# Patient Record
Sex: Male | Born: 1955 | Race: White | Hispanic: No | Marital: Married | Smoking: Former smoker
Health system: Southern US, Community
[De-identification: ages and names within clinical notes are randomized; demographics above are authoritative.]

## PROBLEM LIST (undated history)

## (undated) DIAGNOSIS — M545 Low back pain, unspecified: Secondary | ICD-10-CM

## (undated) DIAGNOSIS — Z9289 Personal history of other medical treatment: Secondary | ICD-10-CM

## (undated) DIAGNOSIS — K469 Unspecified abdominal hernia without obstruction or gangrene: Secondary | ICD-10-CM

## (undated) DIAGNOSIS — I2510 Atherosclerotic heart disease of native coronary artery without angina pectoris: Secondary | ICD-10-CM

## (undated) DIAGNOSIS — Z720 Tobacco use: Secondary | ICD-10-CM

## (undated) DIAGNOSIS — E785 Hyperlipidemia, unspecified: Secondary | ICD-10-CM

## (undated) DIAGNOSIS — L409 Psoriasis, unspecified: Secondary | ICD-10-CM

## (undated) HISTORY — PX: HERNIA REPAIR: SHX51

## (undated) HISTORY — PX: KNEE SURGERY: SHX244

## (undated) HISTORY — DX: Atherosclerotic heart disease of native coronary artery without angina pectoris: I25.10

## (undated) HISTORY — PX: CHOLECYSTECTOMY: SHX55

---

## 2011-09-28 ENCOUNTER — Other Ambulatory Visit: Payer: Self-pay

## 2011-09-28 DIAGNOSIS — R0602 Shortness of breath: Secondary | ICD-10-CM | POA: Insufficient documentation

## 2011-09-28 DIAGNOSIS — R079 Chest pain, unspecified: Secondary | ICD-10-CM | POA: Insufficient documentation

## 2011-09-28 NOTE — ED Notes (Signed)
Pt states that he went to collect some money from a guy in a hotel room and they had been smoking marijuana.  Pt states that the smell set of some chest pain and RUQ abdominal pain.

## 2011-09-29 ENCOUNTER — Emergency Department (HOSPITAL_BASED_OUTPATIENT_CLINIC_OR_DEPARTMENT_OTHER)
Admission: EM | Admit: 2011-09-29 | Discharge: 2011-09-29 | Discharge status: Against Medical Advice | Payer: 59 | Attending: Emergency Medicine | Admitting: Emergency Medicine

## 2011-10-01 ENCOUNTER — Ambulatory Visit
Admission: RE | Admit: 2011-10-01 | Discharge: 2011-10-01 | Disposition: A | Discharge status: Home / Self Care | Payer: 59 | Source: Ambulatory Visit | Attending: Emergency Medicine | Admitting: Emergency Medicine

## 2011-10-01 ENCOUNTER — Other Ambulatory Visit: Payer: Self-pay | Admitting: Emergency Medicine

## 2011-10-01 DIAGNOSIS — M25511 Pain in right shoulder: Secondary | ICD-10-CM

## 2011-10-01 DIAGNOSIS — M542 Cervicalgia: Secondary | ICD-10-CM

## 2016-03-31 DIAGNOSIS — I2510 Atherosclerotic heart disease of native coronary artery without angina pectoris: Secondary | ICD-10-CM

## 2016-03-31 HISTORY — DX: Atherosclerotic heart disease of native coronary artery without angina pectoris: I25.10

## 2016-04-01 ENCOUNTER — Inpatient Hospital Stay
Admission: EM | Admit: 2016-04-01 | Discharge: 2016-04-04 | DRG: 247 | Disposition: A | Discharge status: Home / Self Care | Payer: Managed Care, Other (non HMO) | Attending: Internal Medicine | Admitting: Internal Medicine

## 2016-04-01 ENCOUNTER — Emergency Department: Payer: Managed Care, Other (non HMO)

## 2016-04-01 DIAGNOSIS — K219 Gastro-esophageal reflux disease without esophagitis: Secondary | ICD-10-CM | POA: Diagnosis present

## 2016-04-01 DIAGNOSIS — I2582 Chronic total occlusion of coronary artery: Secondary | ICD-10-CM | POA: Diagnosis present

## 2016-04-01 DIAGNOSIS — I25110 Atherosclerotic heart disease of native coronary artery with unstable angina pectoris: Secondary | ICD-10-CM | POA: Diagnosis present

## 2016-04-01 DIAGNOSIS — I214 Non-ST elevation (NSTEMI) myocardial infarction: Principal | ICD-10-CM | POA: Diagnosis present

## 2016-04-01 DIAGNOSIS — R079 Chest pain, unspecified: Secondary | ICD-10-CM

## 2016-04-01 DIAGNOSIS — E785 Hyperlipidemia, unspecified: Secondary | ICD-10-CM | POA: Diagnosis not present

## 2016-04-01 DIAGNOSIS — I10 Essential (primary) hypertension: Secondary | ICD-10-CM

## 2016-04-01 DIAGNOSIS — E876 Hypokalemia: Secondary | ICD-10-CM | POA: Diagnosis present

## 2016-04-01 DIAGNOSIS — Z8249 Family history of ischemic heart disease and other diseases of the circulatory system: Secondary | ICD-10-CM

## 2016-04-01 DIAGNOSIS — D72829 Elevated white blood cell count, unspecified: Secondary | ICD-10-CM | POA: Diagnosis present

## 2016-04-01 DIAGNOSIS — R739 Hyperglycemia, unspecified: Secondary | ICD-10-CM | POA: Diagnosis present

## 2016-04-01 DIAGNOSIS — F17210 Nicotine dependence, cigarettes, uncomplicated: Secondary | ICD-10-CM | POA: Diagnosis present

## 2016-04-01 DIAGNOSIS — I209 Angina pectoris, unspecified: Secondary | ICD-10-CM | POA: Diagnosis not present

## 2016-04-01 DIAGNOSIS — L409 Psoriasis, unspecified: Secondary | ICD-10-CM | POA: Diagnosis present

## 2016-04-01 DIAGNOSIS — I2510 Atherosclerotic heart disease of native coronary artery without angina pectoris: Secondary | ICD-10-CM | POA: Diagnosis not present

## 2016-04-01 HISTORY — DX: Psoriasis, unspecified: L40.9

## 2016-04-01 HISTORY — DX: Unspecified abdominal hernia without obstruction or gangrene: K46.9

## 2016-04-01 HISTORY — DX: Low back pain: M54.5

## 2016-04-01 HISTORY — DX: Tobacco use: Z72.0

## 2016-04-01 HISTORY — DX: Low back pain, unspecified: M54.50

## 2016-04-01 HISTORY — DX: Hyperlipidemia, unspecified: E78.5

## 2016-04-01 LAB — URINE DRUG SCREEN, QUALITATIVE (ARMC ONLY)
AMPHETAMINES, UR SCREEN: NOT DETECTED
Barbiturates, Ur Screen: NOT DETECTED
Benzodiazepine, Ur Scrn: NOT DETECTED
Cannabinoid 50 Ng, Ur ~~LOC~~: NOT DETECTED
Cocaine Metabolite,Ur ~~LOC~~: NOT DETECTED
MDMA (ECSTASY) UR SCREEN: NOT DETECTED
Methadone Scn, Ur: NOT DETECTED
OPIATE, UR SCREEN: NOT DETECTED
PHENCYCLIDINE (PCP) UR S: NOT DETECTED
Tricyclic, Ur Screen: NOT DETECTED

## 2016-04-01 LAB — LIPASE, BLOOD: Lipase: 22 U/L (ref 11–51)

## 2016-04-01 LAB — CBC WITH DIFFERENTIAL/PLATELET
BASOS ABS: 0.1 10*3/uL (ref 0–0.1)
BASOS PCT: 1 %
Eosinophils Absolute: 0.2 10*3/uL (ref 0–0.7)
Eosinophils Relative: 2 %
HEMATOCRIT: 52 % (ref 40.0–52.0)
HEMOGLOBIN: 17.8 g/dL (ref 13.0–18.0)
Lymphocytes Relative: 21 %
Lymphs Abs: 2.2 10*3/uL (ref 1.0–3.6)
MCH: 30.1 pg (ref 26.0–34.0)
MCHC: 34.3 g/dL (ref 32.0–36.0)
MCV: 87.9 fL (ref 80.0–100.0)
MONOS PCT: 8 %
Monocytes Absolute: 0.8 10*3/uL (ref 0.2–1.0)
NEUTROS ABS: 7.4 10*3/uL — AB (ref 1.4–6.5)
NEUTROS PCT: 68 %
Platelets: 240 10*3/uL (ref 150–440)
RBC: 5.92 MIL/uL — ABNORMAL HIGH (ref 4.40–5.90)
RDW: 13.6 % (ref 11.5–14.5)
WBC: 10.8 10*3/uL — AB (ref 3.8–10.6)

## 2016-04-01 LAB — COMPREHENSIVE METABOLIC PANEL
ALBUMIN: 4.8 g/dL (ref 3.5–5.0)
ALK PHOS: 82 U/L (ref 38–126)
ALT: 14 U/L — ABNORMAL LOW (ref 17–63)
AST: 28 U/L (ref 15–41)
Anion gap: 9 (ref 5–15)
BILIRUBIN TOTAL: 0.7 mg/dL (ref 0.3–1.2)
BUN: 13 mg/dL (ref 6–20)
CALCIUM: 9.4 mg/dL (ref 8.9–10.3)
CO2: 23 mmol/L (ref 22–32)
Chloride: 104 mmol/L (ref 101–111)
Creatinine, Ser: 1.12 mg/dL (ref 0.61–1.24)
GFR calc Af Amer: >60 mL/min (ref >60)
GFR calc non Af Amer: >60 mL/min (ref >60)
GLUCOSE: 127 mg/dL — AB (ref 65–99)
Potassium: 3.1 mmol/L — ABNORMAL LOW (ref 3.5–5.1)
Sodium: 136 mmol/L (ref 135–145)
TOTAL PROTEIN: 8.5 g/dL — AB (ref 6.5–8.1)

## 2016-04-01 LAB — LIPID PANEL
CHOLESTEROL: 199 mg/dL (ref 0–200)
HDL: 31 mg/dL — AB (ref >40)
LDL CALC: 153 mg/dL — AB (ref 0–99)
TRIGLYCERIDES: 76 mg/dL (ref <150)
Total CHOL/HDL Ratio: 6.4 RATIO
VLDL: 15 mg/dL (ref 0–40)

## 2016-04-01 LAB — TROPONIN I
Troponin I: 0.04 ng/mL — ABNORMAL HIGH (ref <0.031)
Troponin I: 1.15 ng/mL — ABNORMAL HIGH (ref <0.031)

## 2016-04-01 LAB — FIBRIN DERIVATIVES D-DIMER (ARMC ONLY): Fibrin derivatives D-dimer (ARMC): 916 — ABNORMAL HIGH (ref 0–499)

## 2016-04-01 LAB — APTT: aPTT: 27 seconds (ref 24–36)

## 2016-04-01 LAB — PROTIME-INR
INR: 0.9
Prothrombin Time: 12.4 seconds (ref 11.4–15.0)

## 2016-04-01 LAB — MAGNESIUM: Magnesium: 2 mg/dL (ref 1.7–2.4)

## 2016-04-01 MED ORDER — IOPAMIDOL (ISOVUE-370) INJECTION 76%
75.0000 mL | Freq: Once | INTRAVENOUS | Status: AC | PRN
  Administered 2016-04-01: 75 mL via INTRAVENOUS

## 2016-04-01 MED ORDER — PANTOPRAZOLE SODIUM 40 MG PO TBEC
40.0000 mg | DELAYED_RELEASE_TABLET | Freq: Every day | ORAL | Status: DC
  Administered 2016-04-01 – 2016-04-04 (×3): 40 mg via ORAL
  Filled 2016-04-01 (×3): qty 1

## 2016-04-01 MED ORDER — NITROGLYCERIN 2 % TD OINT
1.0000 [in_us] | TOPICAL_OINTMENT | Freq: Four times a day (QID) | TRANSDERMAL | Status: DC
  Administered 2016-04-01 – 2016-04-03 (×5): 1 [in_us] via TOPICAL
  Filled 2016-04-01 (×5): qty 1

## 2016-04-01 MED ORDER — METOPROLOL TARTRATE 25 MG PO TABS: 25.0000 mg | ORAL_TABLET | Freq: Four times a day (QID) | ORAL | Status: DC

## 2016-04-01 MED ORDER — NICOTINE POLACRILEX 2 MG MT GUM
2.0000 mg | CHEWING_GUM | OROMUCOSAL | Status: DC | PRN
  Filled 2016-04-01: qty 1

## 2016-04-01 MED ORDER — POTASSIUM CHLORIDE CRYS ER 20 MEQ PO TBCR
40.0000 meq | EXTENDED_RELEASE_TABLET | Freq: Two times a day (BID) | ORAL | Status: AC
  Administered 2016-04-01: 40 meq via ORAL
  Filled 2016-04-01: qty 2

## 2016-04-01 MED ORDER — ASPIRIN 81 MG PO CHEW
324.0000 mg | CHEWABLE_TABLET | Freq: Once | ORAL | Status: AC
  Administered 2016-04-01: 324 mg via ORAL
  Filled 2016-04-01: qty 4

## 2016-04-01 MED ORDER — GI COCKTAIL ~~LOC~~
30.0000 mL | Freq: Once | ORAL | Status: AC
  Administered 2016-04-01: 30 mL via ORAL
  Filled 2016-04-01: qty 30

## 2016-04-01 MED ORDER — HEPARIN BOLUS VIA INFUSION
4000.0000 [IU] | Freq: Once | INTRAVENOUS | Status: AC
  Administered 2016-04-01: 4000 [IU] via INTRAVENOUS
  Filled 2016-04-01: qty 4000

## 2016-04-01 MED ORDER — HEPARIN (PORCINE) IN NACL 100-0.45 UNIT/ML-% IJ SOLN
1100.0000 [IU]/h | INTRAMUSCULAR | Status: DC
  Administered 2016-04-01: 1100 [IU]/h via INTRAVENOUS
  Filled 2016-04-01 (×2): qty 250

## 2016-04-01 MED ORDER — NITROGLYCERIN 2 % TD OINT
1.0000 [in_us] | TOPICAL_OINTMENT | Freq: Once | TRANSDERMAL | Status: AC
  Administered 2016-04-01: 1 [in_us] via TOPICAL
  Filled 2016-04-01: qty 1

## 2016-04-01 MED ORDER — ONDANSETRON HCL 4 MG/2ML IJ SOLN
4.0000 mg | Freq: Once | INTRAMUSCULAR | Status: AC
  Administered 2016-04-01: 4 mg via INTRAVENOUS
  Filled 2016-04-01: qty 2

## 2016-04-01 MED ORDER — NITROGLYCERIN 0.4 MG SL SUBL
0.4000 mg | SUBLINGUAL_TABLET | SUBLINGUAL | Status: DC | PRN
  Administered 2016-04-01 (×2): 0.4 mg via SUBLINGUAL

## 2016-04-01 MED ORDER — HYDROMORPHONE HCL 1 MG/ML IJ SOLN
1.0000 mg | Freq: Once | INTRAMUSCULAR | Status: AC
  Administered 2016-04-01: 1 mg via INTRAVENOUS
  Filled 2016-04-01: qty 1

## 2016-04-01 NOTE — ED Notes (Signed)
Pt satting 89% r/a. Placed on 2 L [place].  

## 2016-04-01 NOTE — Progress Notes (Signed)
ANTICOAGULATION CONSULT NOTE - Initial Consult  Pharmacy Consult for Heparin drip Indication: chest pain/ACS  Allergies  Allergen Reactions  . Fluticasone Swelling    Patient Measurements: Height: 5\' 9"  (175.3 cm) Weight: 200 lb (90.719 kg) IBW/kg (Calculated) : 70.7 Heparin Dosing Weight: na  Vital Signs: Temp: 97.4 F (36.3 C) (04/02 1647) Temp Source: Oral (04/02 1647) BP: 115/68 mmHg (04/02 2121) Pulse Rate: 66 (04/02 2121)  Labs:  Recent Labs  04/01/16 1648 04/01/16 1649 04/01/16 1951  HGB  --  17.8  --   HCT  --  52.0  --   PLT  --  240  --   APTT 27  --   --   LABPROT 12.4  --   --   INR 0.90  --   --   CREATININE  --  1.12  --   TROPONINI  --  0.04* 1.15*    Estimated Creatinine Clearance: 79.1 mL/min (by C-G formula based on Cr of 1.12).   Medical History: History reviewed. No pertinent past medical history.  Medications:  Infusions:  . heparin      Assessment: Patient is a 61yo male admitted for AMI. Pharmacy consulted for heparin dosing.  Goal of Therapy:  Heparin level 0.3-0.7 units/ml Monitor platelets by anticoagulation protocol: Yes   Plan:  Give 4000 units bolus x 1 Start heparin infusion at 1100 units/hr Check anti-Xa level in 6 hours and daily while on heparin Continue to monitor H&H and platelets  Clovia CuffLisa Nicolis Boody, PharmD, BCPS 04/01/2016 9:27 PM

## 2016-04-01 NOTE — ED Notes (Signed)
Troponin 0.04. MD notified.  

## 2016-04-01 NOTE — ED Notes (Signed)
Pt came to ED c/o central chest pain. Pt reports it feels "like the worse acid reflex i've ever had." Pt reports it started about an hour ago when he was doing yard work. Pt denies nausea, vomiting.

## 2016-04-01 NOTE — ED Notes (Signed)
Pt transported to CT ?

## 2016-04-01 NOTE — ED Notes (Addendum)
Charge nurse Sue Lushndrea called telemetry to ask about bed ready status. Secretary informed her that there is no ETA for time that it will take to make the room ready.

## 2016-04-01 NOTE — H&P (Addendum)
[facility] [place] [facility]   PATIENT NAME: Jesse Soto    MR#:  [id number]  DATE OF BIRTH:  Jun 12, 1955  DATE OF ADMISSION:  04/01/2016  PRIMARY CARE PHYSICIAN: No primary care provider on file.   REQUESTING/REFERRING PHYSICIAN:   CHIEF COMPLAINT:   Chief Complaint  Patient presents with  . Chest Pain    HISTORY OF PRESENT ILLNESS: Jesse Soto  is a 61 y.o. male with a known history of Ongoing tobacco abuse, chronic abdominal pain which she attributes to hiatal hernia who comes to the hospital with complaints of right-sided lower chest area pain, which started at around 1:02 PM earlier today after some exertion. Pain was described as 10 out of 10 by intensity, achy pain lasting for approximately 2 hours, radiating to right jaw. Patient felt weak, short of breath, lightheaded, as well as dizzy and presyncopal. On arrival to the emergency room he was noted to have elevated troponin, which got worse on repeat his lab in 4 hours. EKG revealed ST elevation in V2, V3. Hospitalist services were contacted for admission  PAST MEDICAL HISTORY:  History reviewed. No pertinent past medical history.  PAST SURGICAL HISTORY: Past Surgical History  Procedure Laterality Date  . Hernia repair    . Cholecystectomy    . Knee surgery      SOCIAL HISTORY:  Social History  Substance Use Topics  . Smoking status: Current Every Day Smoker -- 1.00 packs/day for 30 years    Types: Cigarettes  . Smokeless tobacco: Never Used  . Alcohol Use: Yes     Comment: occasional use  Admits of using marijuana  FAMILY HISTORY: No early coronary artery disease  DRUG ALLERGIES:  Allergies  Allergen Reactions  . Fluticasone Swelling    Review of Systems  Constitutional: Positive for malaise/fatigue. Negative for fever, chills and weight loss.  HENT: Negative for congestion.   Eyes: Positive for blurred vision. Negative for double vision.  Respiratory: Positive for cough,  sputum production, shortness of breath and wheezing.   Cardiovascular: Positive for chest pain. Negative for palpitations, orthopnea, leg swelling and PND.  Gastrointestinal: Positive for heartburn, nausea, abdominal pain and constipation. Negative for vomiting, diarrhea, blood in stool and melena.  Genitourinary: Positive for dysuria. Negative for urgency, frequency and hematuria.  Musculoskeletal: Negative for falls.  Skin: Negative for rash.  Neurological: Positive for weakness and headaches. Negative for dizziness.  Psychiatric/Behavioral: Negative for depression and memory loss. The patient is not nervous/anxious.     MEDICATIONS AT HOME:  Prior to Admission medications   Medication Sig Start Date End Date Taking? Authorizing Provider  cyclobenzaprine (FLEXERIL) 5 MG tablet Take 5 mg by mouth daily as needed for muscle spasms.    Yes Historical Provider, MD  traMADol (ULTRAM) 50 MG tablet Take 50 mg by mouth daily.    Yes Historical Provider, MD      PHYSICAL EXAMINATION:   VITAL SIGNS: Blood pressure 112/61, pulse 59, temperature 97.4 F (36.3 C), temperature source Oral, resp. rate 15, SpO2 93 %.  GENERAL:  61 y.o.-year-old patient lying in the bed with no acute distress.  EYES: Pupils equal, round, reactive to light and accommodation. No scleral icterus. Extraocular muscles intact.  HEENT: Head atraumatic, normocephalic. Oropharynx and nasopharynx clear.  NECK:  Supple, no jugular venous distention. No thyroid enlargement, no tenderness.  LUNGS: Some diminished breath sounds bilaterally, no wheezing, few scattered rales,rhonchi , but no crepitations on auscultation. No use of accessory muscles of  respiration. Some discomfort on palpation of the chest wall CARDIOVASCULAR: S1, S2 normal. No murmurs, rubs, or gallops.  ABDOMEN: Soft, mild epigastric tenderness, no rebound or guarding, nondistended. Bowel sounds present. No organomegaly or mass.  EXTREMITIES: No pedal edema,  cyanosis, or clubbing.  NEUROLOGIC: Cranial nerves II through XII are intact. Muscle strength 5/5 in all extremities. Sensation intact. Gait not checked.  PSYCHIATRIC: The patient is alert and oriented x 3.  SKIN: No obvious rash, lesion, or ulcer.   LABORATORY PANEL:   CBC  Recent Labs Lab 04/01/16 1649  WBC 10.8*  HGB 17.8  HCT 52.0  PLT 240  MCV 87.9  MCH 30.1  MCHC 34.3  RDW 13.6  LYMPHSABS 2.2  MONOABS 0.8  EOSABS 0.2  BASOSABS 0.1   ------------------------------------------------------------------------------------------------------------------  Chemistries   Recent Labs Lab 04/01/16 1649  NA 136  K 3.1*  CL 104  CO2 23  GLUCOSE 127*  BUN 13  CREATININE 1.12  CALCIUM 9.4  AST 28  ALT 14*  ALKPHOS 82  BILITOT 0.7   ------------------------------------------------------------------------------------------------------------------  Cardiac Enzymes  Recent Labs Lab 04/01/16 1649 04/01/16 1951  TROPONINI 0.04* 1.15*   ------------------------------------------------------------------------------------------------------------------  RADIOLOGY: Ct Angio Chest Pe W/cm &/or Wo Cm  04/01/2016  CLINICAL DATA:  Acute onset of central chest pain. EXAM: CT ANGIOGRAPHY CHEST WITH CONTRAST TECHNIQUE: Multidetector CT imaging of the chest was performed using the standard protocol during bolus administration of intravenous contrast. Multiplanar CT image reconstructions and MIPs were obtained to evaluate the vascular anatomy. CONTRAST:  75 cc Isovue 370 COMPARISON:  None. FINDINGS: Mediastinum/Nodes: No chest wall mass, supraclavicular or axillary lymphadenopathy. The heart is normal in size. No pericardial effusion. The aorta is normal in caliber. No dissection. Scattered atherosclerotic calcifications at the aortic arch. The branch vessels are patent. The pulmonary arterial tree is well opacified. No filling defects to suggest pulmonary embolism. No mediastinal or hilar  mass or adenopathy. Small scattered lymph nodes are noted. Lungs/Pleura: Emphysematous changes and pulmonary scarring. There is dependent atelectasis/edema but no infiltrates, effusions or worrisome pulmonary lesions. Upper abdomen: No significant findings. Musculoskeletal: No significant findings. Moderate degenerative changes in the lower thoracic spine and scoliosis. Review of the MIP images confirms the above findings. IMPRESSION: 1. No CT findings for pulmonary embolism. 2. Mild atherosclerotic calcifications involving the aorta but no aneurysm or dissection. 3. Emphysematous changes and pulmonary scarring. Dependent atelectasis but no infiltrates or effusions. Electronically Signed   By: Rudie Meyer M.D.   On: 04/01/2016 19:14   Dg Chest Port 1 View  04/01/2016  CLINICAL DATA:  Central chest pain beginning this afternoon while doing yard work. EXAM: PORTABLE CHEST 1 VIEW COMPARISON:  None. FINDINGS: Lungs are adequately inflated without focal consolidation or effusion. Cardiomediastinal silhouette is within normal. Minimal degenerative change of the spine. IMPRESSION: No acute cardiopulmonary disease. Electronically Signed   By: Elberta Fortis M.D.   On: 04/01/2016 17:23    EKG: Orders placed or performed during the hospital encounter of 04/01/16  . EKG 12-Lead  . EKG 12-Lead  . EKG 12-Lead  . EKG 12-Lead  . EKG 12-Lead  . EKG 12-Lead  . EKG 12-Lead  . EKG 12-Lead   Initial EKG was reported as atrial fibrillation. However, repeated EKG in 2 minutes revealed normal sinus rhythm with abnormal R-wave progression, early transition, elevated ST in V2, V3, concerning for anterior injury, nonspecific ST-T changes in all other leads, one more EKG done at around 8 PM revealed sinus rhythm at  70 bpm, normal axis, ventricular premature complex, pursue infarcts, old, borderline T abnormalities in inferior leads IMPRESSION AND PLAN:  Active Problems:   Acute MI, subendocardial, initial episode of care  (HCC)   HTN (hypertension)   Subendocardial MI first episode care [facility](HCC) #1 Acute MI, subendocardial, initial episode of care, and the patient, medical follow initiate him on metoprolol, aspirin, nitroglycerin and heparin intravenously, following cardiac enzymes, sensory, getting cardiologist involved for further recommendations, possibly cardiac catheterization tomorrow morning . Get urine drug screen and lipid panel #2. Elevated blood pressure without known history of Hypertension,  continue patient on metoprolol, nitroglycerin #3. Leukocytosis, unclear etiology #4. Hypokalemia, supplement orally, check magnesium level and supplemented as needed. #5. Hyperglycemia, get hemoglobin A1c #6. Epigastric abdominal pain, questionable gastritis, initiate patient on Protonix #7 tobacco abuse counseling. Discussed this patient for approximately 5 minutes, nicotine replacement therapy will be initiated, patient is agreeable  All the records are reviewed and case discussed with ED provider. Management plans discussed with the patient, family and they are in agreement.  CODE STATUS: Code Status History    This patient does not have a recorded code status. Please follow your organizational policy for patients in this situation.       TOTAL TIME TAKING CARE OF THIS PATIENT: 55 minutes.    Katharina CaperVAICKUTE,Dream Nodal M.D on 04/01/2016 at 9:15 PM  Between 7am to 6pm - Pager - [phone] After 6pm go to [email or web address] - password [email or web address] [facility]  [facility]  Office  [phone]  CC: Primary care physician; No primary care provider on file.

## 2016-04-01 NOTE — ED Notes (Signed)
Called to 2A and spoke with charge nurse Lexi. Per CN they are going to have to change the room number and are waiting for it to be cleaned. This RN will wait for the new room to be ready.

## 2016-04-01 NOTE — ED Notes (Signed)
AC aware of time to bed ready. Will continue to wait.

## 2016-04-01 NOTE — ED Provider Notes (Signed)
Time Seen: Approximately 1650 I have reviewed the triage notes  Chief Complaint: Chest Pain   History of Present Illness: Jesse Soto is a 61 y.o. male *who presents with a relatively acute onset of some midline chest discomfort. Patient states he has a history of a hiatal hernia and has had previous surgery. He states he was seen recently had an outside hospital for the discomfort from the epigastric area and was referred back to his primary physician. Since holding the right lower part of his chest but again states the pain is epigastric and midline. He denies any radiation to the arm, jaw, or back region. He has had some nausea but no vomiting. Patient denies any true shortness of breath or pleuritic or positional component to his pain. He is not aware of any fever, chills or productive cough. He describes this as being similar to his reflux pain but states is the "" worst reflux pain he's ever had "". He states he has chronic chest discomfort higher up and points mainly to the sternal area and states he's been doing a lot of hard work and lifting outside and is not unusual for him to have pain in this area.   History reviewed. No pertinent past medical history.  Patient Active Problem List   Diagnosis Date Noted  . Acute MI, subendocardial, initial episode of care (HCC) 04/01/2016  . HTN (hypertension) 04/01/2016  . Subendocardial MI first episode care [facility]) 04/01/2016    Past Surgical History  Procedure Laterality Date  . Hernia repair    . Cholecystectomy    . Knee surgery      Past Surgical History  Procedure Laterality Date  . Hernia repair    . Cholecystectomy    . Knee surgery      Current Outpatient Rx  Name  Route  Sig  Dispense  Refill  . cyclobenzaprine (FLEXERIL) 5 MG tablet   Oral   Take 5 mg by mouth daily as needed for muscle spasms.          . traMADol (ULTRAM) 50 MG tablet   Oral   Take 50 mg by mouth daily.            Allergies:   Fluticasone  Family History: No family history on file.  Social History: Social History  Substance Use Topics  . Smoking status: Current Every Day Smoker -- 1.00 packs/day for 30 years    Types: Cigarettes  . Smokeless tobacco: Never Used  . Alcohol Use: Yes     Comment: occasional use     Review of Systems:   10 point review of systems was performed and was otherwise negative:  Constitutional: No fever Eyes: No visual disturbances ENT: No sore throat, ear pain Cardiac: Chest and epigastric pain described above Respiratory: No shortness of breath, wheezing, or stridor Abdomen: No abdominal pain, no vomiting, No diarrhea Endocrine: No weight loss, No night sweats Extremities: No peripheral edema, cyanosis Skin: No rashes, easy bruising Neurologic: No focal weakness, trouble with speech or swollowing Urologic: No dysuria, Hematuria, or urinary frequency   Physical Exam:  ED Triage Vitals  Enc Vitals Group     BP 04/01/16 1647 150/92 mmHg     Pulse Rate 04/01/16 1647 74     Resp 04/01/16 1647 14     Temp 04/01/16 1647 97.4 F (36.3 C)     Temp Source 04/01/16 1647 Oral     SpO2 04/01/16 1647 95 %  Weight 04/01/16 2124 200 lb (90.719 kg)     Height 04/01/16 2124 5\' 9"  (1.753 m)     Head Cir --      Peak Flow --      Pain Score 04/01/16 1923 1     Pain Loc --      Pain Edu? --      Excl. in GC? --     General: Awake , Alert , and Oriented times 3; GCS 15. Anxious and appears uncomfortable with no diaphoresis and no active vomiting Head: Normal cephalic , atraumatic Eyes: Pupils equal , round, reactive to light Nose/Throat: No nasal drainage, patent upper airway without erythema or exudate.  Neck: Supple, Full range of motion, No anterior adenopathy or palpable thyroid masses Lungs: Clear to ascultation without wheezes , rhonchi, or rales Heart: Regular rate, regular rhythm without murmurs , gallops , or rubs Abdomen: Soft, non tender without rebound,  guarding , or rigidity; bowel sounds positive and symmetric in all 4 quadrants. No organomegaly .        Extremities: 2 plus symmetric pulses. No edema, clubbing or cyanosis Neurologic: normal ambulation, Motor symmetric without deficits, sensory intact Skin: warm, dry, no rashes Chest wall shows reproducible pain mid sternal region without any crepitus or step-off noted no surrounding erythema.  Labs:   All laboratory work was reviewed including any pertinent negatives or positives listed below:  Labs Reviewed  CBC WITH DIFFERENTIAL/PLATELET - Abnormal; Notable for the following:    WBC 10.8 (*)    RBC 5.92 (*)    Neutro Abs 7.4 (*)    All other components within normal limits  COMPREHENSIVE METABOLIC PANEL - Abnormal; Notable for the following:    Potassium 3.1 (*)    Glucose, Bld 127 (*)    Total Protein 8.5 (*)    ALT 14 (*)    All other components within normal limits  TROPONIN I - Abnormal; Notable for the following:    Troponin I 0.04 (*)    All other components within normal limits  FIBRIN DERIVATIVES D-DIMER ([facility] ONLY) - Abnormal; Notable for the following:    Fibrin derivatives D-dimer ([facility]) 916 (*)    All other components within normal limits  TROPONIN I - Abnormal; Notable for the following:    Troponin I 1.15 (*)    All other components within normal limits  LIPASE, BLOOD  URINE DRUG SCREEN, QUALITATIVE ([facility] ONLY)  PROTIME-INR  APTT  HEPARIN LEVEL (UNFRACTIONATED)  CBC  LIPID PANEL  MAGNESIUM  HEMOGLOBIN A1C   The patient's laboratory work shows an increasing troponin. His D-dimer test was elevated  EKG: EKG #1 1644 Rate? Insert normal EKG Poor quality with some nonspecific ST wave elevation seen in leads V1 and V2, single PVC No obvious STEMI criteria and EKG was repeated  EKG #2 ED ECG REPORT I, Jennye MoccasinBrian S Shynice Sigel, the attending physician, personally viewed and interpreted this ECG.  Date: 04/01/2016 EKG Time: 1646 Rate: *81 Rhythm: normal sinus  rhythm QRS Axis: normal Intervals: normal ST/T Wave abnormalities: Nonspecific ST wave elevation lead V1 and V2 Mild ST depressions seen in lead V5 V6 Conduction Disturbances: none Narrative Interpretation: Questionable ischemic changes without any STEMI criteria  EKG #3 ED ECG REPORT I, Jennye MoccasinBrian S Amiria Orrison, the attending physician, personally viewed and interpreted this ECG.  Date: 04/01/2016 EKG Time: 2036 Rate: 70 Rhythm: normal sinus rhythm with occasional PVC QRS Axis: normal Intervals: normal ST/T Wave abnormalities: Continue nonspecific ST wave elevation lead V2,  resolution of ST depression seen in the lateral leads Nonspecific T wave abnormality seen in leads 3 Conduction Disturbances: none Narrative Interpretation: Concerning for ischemic changes but again no STEMI criteria and minimal change from previous EKG   Radiology: *  CLINICAL DATA: Acute onset of central chest pain.  EXAM: CT ANGIOGRAPHY CHEST WITH CONTRAST  TECHNIQUE: Multidetector CT imaging of the chest was performed using the standard protocol during bolus administration of intravenous contrast. Multiplanar CT image reconstructions and MIPs were obtained to evaluate the vascular anatomy.  CONTRAST: 75 cc Isovue 370  COMPARISON: None.  FINDINGS: Mediastinum/Nodes: No chest wall mass, supraclavicular or axillary lymphadenopathy.  The heart is normal in size. No pericardial effusion. The aorta is normal in caliber. No dissection. Scattered atherosclerotic calcifications at the aortic arch. The branch vessels are patent.  The pulmonary arterial tree is well opacified. No filling defects to suggest pulmonary embolism.  No mediastinal or hilar mass or adenopathy. Small scattered lymph nodes are noted.  Lungs/Pleura: Emphysematous changes and pulmonary scarring. There is dependent atelectasis/edema but no infiltrates, effusions or worrisome pulmonary lesions.  Upper abdomen: No significant  findings.  Musculoskeletal: No significant findings. Moderate degenerative changes in the lower thoracic spine and scoliosis.  Review of the MIP images confirms the above findings.  IMPRESSION: 1. No CT findings for pulmonary embolism. 2. Mild atherosclerotic calcifications involving the aorta but no aneurysm or dissection. 3. Emphysematous changes and pulmonary scarring. Dependent atelectasis but no infiltrates or effusions.    I personally reviewed the radiologic studies    Critical Care:  CRITICAL CARE Performed by: Jennye Moccasin   Total critical care time: 35 minutes  Critical care time was exclusive of separately billable procedures and treating other patients.  Critical care was necessary to treat or prevent imminent or life-threatening deterioration.  Critical care was time spent personally by me on the following activities: development of treatment plan with patient and/or surrogate as well as nursing, discussions with consultants, evaluation of patient's response to treatment, examination of patient, obtaining history from patient or surrogate, ordering and performing treatments and interventions, ordering and review of laboratory studies, ordering and review of radiographic studies, pulse oximetry and re-evaluation of patient's condition. Initial evaluation for unstable angina versus acute myocardial infarction    ED Course:  Differential includes all life-threatening causes for chest pain. This includes but is not exclusive to acute coronary syndrome, aortic dissection, pulmonary embolism, cardiac tamponade, community-acquired pneumonia, pericarditis, musculoskeletal chest wall pain, etc. The patient was placed on a continuous cardiac monitor had some occasional PVCs but remained in a normal sinus rhythm. He was given aspirin and nitroglycerin therapy with resolution of the majority of his pain with only a dull ache remaining. His presentation was very nonspecific  and was initially advertises being gastrointestinal above concern is with the repeat troponin and a significant bump over the last troponin patient most likely has at the very least unstable angina and will require further inpatient observation. He is otherwise remained hemodynamically stable.    Assessment:  Acute coronary syndrome   Final Clinical Impression:   Final diagnoses:  Chest pain     Plan:  Inpatient management            Jennye Moccasin, MD 04/01/16 2205

## 2016-04-02 ENCOUNTER — Encounter
Admission: EM | Disposition: A | Discharge status: Home / Self Care | Payer: Self-pay | Source: Home / Self Care | Attending: Internal Medicine

## 2016-04-02 DIAGNOSIS — I214 Non-ST elevation (NSTEMI) myocardial infarction: Principal | ICD-10-CM

## 2016-04-02 DIAGNOSIS — I2510 Atherosclerotic heart disease of native coronary artery without angina pectoris: Secondary | ICD-10-CM

## 2016-04-02 HISTORY — PX: PERIPHERAL VASCULAR CATHETERIZATION: SHX172C

## 2016-04-02 HISTORY — PX: CARDIAC CATHETERIZATION: SHX172

## 2016-04-02 LAB — COMPREHENSIVE METABOLIC PANEL
ALT: 17 U/L (ref 17–63)
AST: 93 U/L — AB (ref 15–41)
Albumin: 3.7 g/dL (ref 3.5–5.0)
Alkaline Phosphatase: 68 U/L (ref 38–126)
Anion gap: 2 — ABNORMAL LOW (ref 5–15)
BUN: 15 mg/dL (ref 6–20)
CHLORIDE: 108 mmol/L (ref 101–111)
CO2: 26 mmol/L (ref 22–32)
CREATININE: 0.95 mg/dL (ref 0.61–1.24)
Calcium: 8.3 mg/dL — ABNORMAL LOW (ref 8.9–10.3)
GFR calc Af Amer: >60 mL/min (ref >60)
Glucose, Bld: 111 mg/dL — ABNORMAL HIGH (ref 65–99)
Potassium: 3.6 mmol/L (ref 3.5–5.1)
SODIUM: 136 mmol/L (ref 135–145)
Total Bilirubin: 0.6 mg/dL (ref 0.3–1.2)
Total Protein: 6.6 g/dL (ref 6.5–8.1)

## 2016-04-02 LAB — CBC
HCT: 45.2 % (ref 40.0–52.0)
HEMOGLOBIN: 15.6 g/dL (ref 13.0–18.0)
MCH: 30.1 pg (ref 26.0–34.0)
MCHC: 34.4 g/dL (ref 32.0–36.0)
MCV: 87.4 fL (ref 80.0–100.0)
Platelets: 204 10*3/uL (ref 150–440)
RBC: 5.17 MIL/uL (ref 4.40–5.90)
RDW: 13.3 % (ref 11.5–14.5)
WBC: 10.6 10*3/uL (ref 3.8–10.6)

## 2016-04-02 LAB — HEMOGLOBIN A1C: Hgb A1c MFr Bld: 5.7 % (ref 4.0–6.0)

## 2016-04-02 LAB — HEPARIN LEVEL (UNFRACTIONATED): Heparin Unfractionated: 0.68 IU/mL (ref 0.30–0.70)

## 2016-04-02 LAB — MRSA PCR SCREENING: MRSA by PCR: NEGATIVE

## 2016-04-02 SURGERY — LEFT HEART CATH AND CORONARY ANGIOGRAPHY
Anesthesia: Moderate Sedation

## 2016-04-02 MED ORDER — TIROFIBAN HCL IN NACL 5-0.9 MG/100ML-% IV SOLN: 0.1500 ug/kg/min | INTRAVENOUS | Status: AC

## 2016-04-02 MED ORDER — HEPARIN SODIUM (PORCINE) 1000 UNIT/ML IJ SOLN
INTRAMUSCULAR | Status: AC
  Filled 2016-04-02: qty 1

## 2016-04-02 MED ORDER — VERAPAMIL HCL 2.5 MG/ML IV SOLN
INTRAVENOUS | Status: DC | PRN
  Administered 2016-04-02: 2.5 mg via INTRA_ARTERIAL

## 2016-04-02 MED ORDER — FENTANYL CITRATE (PF) 100 MCG/2ML IJ SOLN
INTRAMUSCULAR | Status: DC | PRN
  Administered 2016-04-02: 50 ug via INTRAVENOUS
  Administered 2016-04-02: 25 ug via INTRAVENOUS

## 2016-04-02 MED ORDER — SODIUM CHLORIDE 0.9 % WEIGHT BASED INFUSION: 1.0000 mL/kg/h | INTRAVENOUS | Status: DC

## 2016-04-02 MED ORDER — ASPIRIN 81 MG PO CHEW
CHEWABLE_TABLET | ORAL | Status: AC
  Filled 2016-04-02: qty 4

## 2016-04-02 MED ORDER — ATORVASTATIN CALCIUM 20 MG PO TABS
80.0000 mg | ORAL_TABLET | Freq: Every day | ORAL | Status: DC
  Administered 2016-04-02 – 2016-04-03 (×2): 80 mg via ORAL
  Filled 2016-04-02 (×2): qty 4

## 2016-04-02 MED ORDER — MORPHINE SULFATE (PF) 2 MG/ML IV SOLN
2.0000 mg | INTRAVENOUS | Status: DC
  Administered 2016-04-02 – 2016-04-03 (×5): 2 mg via INTRAVENOUS
  Filled 2016-04-02 (×5): qty 1

## 2016-04-02 MED ORDER — ASPIRIN 81 MG PO CHEW: 81.0000 mg | CHEWABLE_TABLET | ORAL | Status: DC

## 2016-04-02 MED ORDER — SODIUM CHLORIDE 0.9 % IV SOLN: 250.0000 mL | INTRAVENOUS | Status: DC | PRN

## 2016-04-02 MED ORDER — ACETAMINOPHEN 325 MG PO TABS: 650.0000 mg | ORAL_TABLET | Freq: Four times a day (QID) | ORAL | Status: DC | PRN

## 2016-04-02 MED ORDER — SODIUM CHLORIDE 0.9% FLUSH
3.0000 mL | Freq: Two times a day (BID) | INTRAVENOUS | Status: DC
  Administered 2016-04-02 – 2016-04-04 (×4): 3 mL via INTRAVENOUS

## 2016-04-02 MED ORDER — ACETAMINOPHEN 650 MG RE SUPP: 650.0000 mg | Freq: Four times a day (QID) | RECTAL | Status: DC | PRN

## 2016-04-02 MED ORDER — MORPHINE SULFATE (PF) 2 MG/ML IV SOLN
INTRAVENOUS | Status: AC
  Administered 2016-04-02: 2 mg via INTRAVENOUS
  Filled 2016-04-02: qty 1

## 2016-04-02 MED ORDER — ASPIRIN 81 MG PO CHEW
CHEWABLE_TABLET | ORAL | Status: DC | PRN
  Administered 2016-04-02: 324 mg via ORAL

## 2016-04-02 MED ORDER — ASPIRIN 81 MG PO CHEW
81.0000 mg | CHEWABLE_TABLET | Freq: Every day | ORAL | Status: DC
  Administered 2016-04-03 – 2016-04-04 (×2): 81 mg via ORAL
  Filled 2016-04-02 (×2): qty 1

## 2016-04-02 MED ORDER — NITROGLYCERIN 0.4 MG SL SUBL
SUBLINGUAL_TABLET | SUBLINGUAL | Status: AC
Start: 2016-04-02 — End: 2016-04-02
  Filled 2016-04-02: qty 1

## 2016-04-02 MED ORDER — METOPROLOL TARTRATE 25 MG PO TABS
25.0000 mg | ORAL_TABLET | Freq: Two times a day (BID) | ORAL | Status: DC
Start: 2016-04-02 — End: 2016-04-04
  Administered 2016-04-02 – 2016-04-04 (×3): 25 mg via ORAL
  Filled 2016-04-02 (×3): qty 1

## 2016-04-02 MED ORDER — HEPARIN (PORCINE) IN NACL 100-0.45 UNIT/ML-% IJ SOLN: 1000.0000 [IU]/h | INTRAMUSCULAR | Status: DC

## 2016-04-02 MED ORDER — MIDAZOLAM HCL 2 MG/2ML IJ SOLN
INTRAMUSCULAR | Status: AC
  Filled 2016-04-02: qty 2

## 2016-04-02 MED ORDER — HEPARIN BOLUS VIA INFUSION: 4000.0000 [IU] | Freq: Once | INTRAVENOUS | Status: DC

## 2016-04-02 MED ORDER — TIROFIBAN HCL IN NACL 5-0.9 MG/100ML-% IV SOLN
INTRAVENOUS | Status: DC | PRN
  Administered 2016-04-02: 0.075 ug/kg/min via INTRAVENOUS

## 2016-04-02 MED ORDER — FENTANYL CITRATE (PF) 100 MCG/2ML IJ SOLN
INTRAMUSCULAR | Status: AC
  Filled 2016-04-02: qty 2

## 2016-04-02 MED ORDER — TIROFIBAN (AGGRASTAT) BOLUS VIA INFUSION
INTRAVENOUS | Status: DC | PRN
  Administered 2016-04-02: 2267.5 ug via INTRAVENOUS

## 2016-04-02 MED ORDER — ONDANSETRON HCL 4 MG PO TABS: 4.0000 mg | ORAL_TABLET | Freq: Four times a day (QID) | ORAL | Status: DC | PRN

## 2016-04-02 MED ORDER — ASPIRIN EC 325 MG PO TBEC: 325.0000 mg | DELAYED_RELEASE_TABLET | Freq: Every day | ORAL | Status: DC

## 2016-04-02 MED ORDER — SODIUM CHLORIDE 0.9 % IV SOLN: INTRAVENOUS | Status: AC

## 2016-04-02 MED ORDER — SODIUM CHLORIDE 0.9 % WEIGHT BASED INFUSION: 3.0000 mL/kg/h | INTRAVENOUS | Status: DC

## 2016-04-02 MED ORDER — SODIUM CHLORIDE 0.9% FLUSH: 3.0000 mL | INTRAVENOUS | Status: DC | PRN

## 2016-04-02 MED ORDER — TICAGRELOR 90 MG PO TABS
ORAL_TABLET | ORAL | Status: DC | PRN
  Administered 2016-04-02: 180 mg via ORAL

## 2016-04-02 MED ORDER — TRAMADOL HCL 50 MG PO TABS
50.0000 mg | ORAL_TABLET | Freq: Every day | ORAL | Status: DC
  Administered 2016-04-03 – 2016-04-04 (×2): 50 mg via ORAL
  Filled 2016-04-02 (×2): qty 1

## 2016-04-02 MED ORDER — MORPHINE SULFATE (PF) 4 MG/ML IV SOLN
4.0000 mg | INTRAVENOUS | Status: DC | PRN
  Filled 2016-04-02: qty 1

## 2016-04-02 MED ORDER — MORPHINE SULFATE (PF) 2 MG/ML IV SOLN
2.0000 mg | Freq: Once | INTRAVENOUS | Status: AC
  Administered 2016-04-02: 2 mg via INTRAVENOUS

## 2016-04-02 MED ORDER — CYCLOBENZAPRINE HCL 10 MG PO TABS: 5.0000 mg | ORAL_TABLET | Freq: Every day | ORAL | Status: DC | PRN

## 2016-04-02 MED ORDER — ALUM & MAG HYDROXIDE-SIMETH 200-200-20 MG/5ML PO SUSP
ORAL | Status: AC
  Filled 2016-04-02: qty 30

## 2016-04-02 MED ORDER — TICAGRELOR 90 MG PO TABS
ORAL_TABLET | ORAL | Status: AC
  Filled 2016-04-02: qty 2

## 2016-04-02 MED ORDER — SODIUM CHLORIDE 0.9% FLUSH
3.0000 mL | Freq: Two times a day (BID) | INTRAVENOUS | Status: DC
  Administered 2016-04-02: 3 mL via INTRAVENOUS

## 2016-04-02 MED ORDER — HEPARIN SODIUM (PORCINE) 1000 UNIT/ML IJ SOLN
INTRAMUSCULAR | Status: DC | PRN
  Administered 2016-04-02: 2000 [IU] via INTRAVENOUS
  Administered 2016-04-02: 4500 [IU] via INTRAVENOUS

## 2016-04-02 MED ORDER — IOHEXOL 300 MG/ML  SOLN
INTRAMUSCULAR | Status: DC | PRN
  Administered 2016-04-02: 200 mL via INTRA_ARTERIAL

## 2016-04-02 MED ORDER — VERAPAMIL HCL 2.5 MG/ML IV SOLN
INTRAVENOUS | Status: AC
  Filled 2016-04-02: qty 2

## 2016-04-02 MED ORDER — MIDAZOLAM HCL 5 MG/ML IJ SOLN
1.0000 mg | Freq: Once | INTRAMUSCULAR | Status: AC
  Administered 2016-04-02: 1 mg via INTRAVENOUS
  Filled 2016-04-02: qty 1

## 2016-04-02 MED ORDER — ONDANSETRON HCL 4 MG/2ML IJ SOLN: 4.0000 mg | Freq: Four times a day (QID) | INTRAMUSCULAR | Status: DC | PRN

## 2016-04-02 MED ORDER — MIDAZOLAM HCL 2 MG/2ML IJ SOLN
INTRAMUSCULAR | Status: DC | PRN
  Administered 2016-04-02 (×2): 1 mg via INTRAVENOUS

## 2016-04-02 MED ORDER — TICAGRELOR 90 MG PO TABS
90.0000 mg | ORAL_TABLET | Freq: Two times a day (BID) | ORAL | Status: DC
  Administered 2016-04-02 – 2016-04-04 (×4): 90 mg via ORAL
  Filled 2016-04-02 (×4): qty 1

## 2016-04-02 SURGICAL SUPPLY — 17 items
BALLN TREK RX 2.75X15 (BALLOONS) ×4
BALLN ~~LOC~~ TREK RX 4.5X12 (BALLOONS) ×4
BALLOON TREK RX 2.75X15 (BALLOONS) ×2 IMPLANT
BALLOON ~~LOC~~ TREK RX 4.5X12 (BALLOONS) ×2 IMPLANT
CATH INFINITI 5 FR JL3.5 (CATHETERS) ×4 IMPLANT
CATH INFINITI 5FR ANG PIGTAIL (CATHETERS) ×4 IMPLANT
CATH OPTITORQUE JACKY 4.0 5F (CATHETERS) ×4 IMPLANT
CATH PRIORITY ONE AC 6F (CATHETERS) ×4 IMPLANT
CATH VISTA GUIDE 6FR JR4 (CATHETERS) ×4 IMPLANT
DEVICE INFLAT 30 PLUS (MISCELLANEOUS) ×4 IMPLANT
DEVICE RAD TR BAND REGULAR (VASCULAR PRODUCTS) ×4 IMPLANT
GLIDESHEATH SLEND SS 6F .021 (SHEATH) ×4 IMPLANT
KIT MANI 3VAL PERCEP (MISCELLANEOUS) ×4 IMPLANT
PACK CARDIAC CATH (CUSTOM PROCEDURE TRAY) ×4 IMPLANT
STENT XIENCE ALPINE RX 4.0X23 (Permanent Stent) ×4 IMPLANT
WIRE RUNTHROUGH .014X180CM (WIRE) ×4 IMPLANT
WIRE SAFE-T 1.5MM-J .035X260CM (WIRE) ×4 IMPLANT

## 2016-04-02 NOTE — Progress Notes (Signed)
A & O. Ambulated to BR and tolerated it well. Room air. NSR. Takes meds ok. R radial access no bleeding no hematoma. Tolerating diet well. Pt has no further concerns at this time.

## 2016-04-02 NOTE — Plan of Care (Signed)
Problem: Education: Goal: Knowledge of Short General Education information/materials will improve Outcome: Progressing Pt given welcome book. Introduced to Haematologiststaff. Questions answered about unit.

## 2016-04-02 NOTE — Plan of Care (Signed)
Problem: Phase I Progression Outcomes Goal: Voiding-avoid urinary catheter unless indicated Outcome: Completed/Met Date Met:  04/02/16 Pt has ambulated to the bathroom today.

## 2016-04-02 NOTE — Progress Notes (Signed)
ANTICOAGULATION CONSULT NOTE - Initial Consult  Pharmacy Consult for Heparin drip Indication: chest pain/ACS  Allergies  Allergen Reactions  . Fluticasone Swelling    Patient Measurements: Height: 5\' 9"  (175.3 cm) Weight: 200 lb (90.719 kg) IBW/kg (Calculated) : 70.7 Heparin Dosing Weight: na  Vital Signs: Temp: 97.5 F (36.4 C) (04/03 0059) Temp Source: Oral (04/03 0059) BP: 119/56 mmHg (04/03 0059) Pulse Rate: 57 (04/03 0059)  Labs:  Recent Labs  04/01/16 1648 04/01/16 1649 04/01/16 1951 04/02/16 0444  HGB  --  17.8  --  15.6  HCT  --  52.0  --  45.2  PLT  --  240  --  204  APTT 27  --   --   --   LABPROT 12.4  --   --   --   INR 0.90  --   --   --   HEPARINUNFRC  --   --   --  0.68  CREATININE  --  1.12  --  0.95  TROPONINI  --  0.04* 1.15*  --     Estimated Creatinine Clearance: 93.2 mL/min (by C-G formula based on Cr of 0.95).   Medical History: History reviewed. No pertinent past medical history.  Medications:  Infusions:  . heparin 1,100 Units/hr (04/01/16 2154)    Assessment: Patient is a 61yo male admitted for AMI. Pharmacy consulted for heparin dosing.  Goal of Therapy:  Heparin level 0.3-0.7 units/ml Monitor platelets by anticoagulation protocol: Yes   Plan:  First heparin level therapeutic. Continue current rate. Will recheck heparin level in 6 hours  Lucio Litsey A. [place], Vermont[place]arm.D., BCPS Clinical Pharmacist  04/02/2016 5:36 AM

## 2016-04-02 NOTE — Plan of Care (Signed)
Problem: Phase I Progression Outcomes Goal: Anginal pain relieved Outcome: Progressing Pt chest pain is decreased to 2/10. Pt denying further interventions at this time. Resting at this time.

## 2016-04-02 NOTE — Progress Notes (Signed)
Pt is complaining of 2/10 chest pain. Denies N/V, dizziness.  Denies pain medication. Pt is sleeping on right side. Right Radial sight Level Zero, no bleeding, no bruising.   Report given to oncoming RN

## 2016-04-02 NOTE — Progress Notes (Signed)
    Patient with lower chest/epigastric pain s/p eating soup and drinking sweet tea. He reports he usually does not use sugar, only Splenda. No associated symptoms including diaphoresis, nausea, vomiting, palpitations, presyncope, or syncope. STAT 12-lead EKG was checked and showed no acute st/t changes. Some improvement in anterior st/t changes. Case discussed with Dr. Kirke CorinArida. Will place patient on schedule morphine 2 mg q 4 hours. He will be transferred to step down by IM.   Eula Listenyan Dunn, PA-C

## 2016-04-02 NOTE — Progress Notes (Signed)
Report called to [facility] in ICU 7. Pt reported chest pain and did not want to receive pain meds. BP stable.

## 2016-04-02 NOTE — Consult Note (Signed)
Cardiology Consultation Note  Patient ID: Jesse PollRicky Lamia, MRN: [id number], DOB/AGE: [birth year] 60 y.o. Admit date: 04/01/2016   Date of Consult: 04/02/2016 Primary Physician: No primary care provider on file. [facility]  Chief Complaint: Chest pain Reason for Consult: NSTEMI  HPI: 61 y.o. male with h/o ongoing tobacco abuse, low back pain, abdominal hernia, and recently diagnosed HLD [facility] with right-sided chest pain and was found to have a NSTEMI with troponin of 1.15 to date.   He previously had treadmill stress test in 1995 as part of a pre-operative evaluation for his abdominal surgery that he reports as being normal. Back in 1995 when his abdominal hernia was repaired he thought this was an MI at that time. However, his stress test resulted as normal as above. He underwent hernia repair and his symptoms resolved. 0Results are not available at this time. He denies any prior cardiac catheterization or having a cardiologist. He has a 30 pack hear history of tobacco abuse, smoking 1 ppd. He denies any alcohol or illegal drug abuse. He also denies any DM or HTN. He has noted some intermittent chest pain over the past several months coupled with a decline in energy, though no pain as severe as the pain that brought him to the hospital on 4/2. 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 This was followed by the development of his right-sided chest pain. There were no associated symptoms of diaphoresis, nausea, vomiting, palpitations, presyncope, or syncope. His symptoms lasted until he got to the hospital and received the below medications.   Upon the [facility]'s [facility] they were found to have troponin of 0.04-->1.15, d-dimer elevated at 916, CTA chest negative for PE, negative lipase, negative urine drug screen, initial WBC 10.8-->10.6 o/w unremarkable CBC, SCr 1.12-->0.95, K+ 3.1-->3.6,  Mg++ 2.0, LDL 153, TC 199, TG 76. ECG showed NSR, 70 bpm, rare PVC, 2 mm ST elevation in V2, 1 mm ST elevation in V3, inferior TWI, CXR showed no acute cardiopulmonary process. He was started on heparin gtt. He has been chest pain free since receiving aspirin 324, Dilaudid, and nitro patch.    Past Medical History  Diagnosis Date  . HLD (hyperlipidemia)   . Tobacco abuse   . Low back pain   . Psoriasis   . Abdominal hernia       Most Recent Cardiac Studies: None   Surgical History:  Past Surgical History  Procedure Laterality Date  . Hernia repair    . Cholecystectomy    . Knee surgery       Home Meds: Prior to Admission medications   Medication Sig Start Date End Date Taking? Authorizing Provider  cyclobenzaprine (FLEXERIL) 5 MG tablet Take 5 mg by mouth daily as needed for muscle spasms.    Yes Historical Provider, MD  traMADol (ULTRAM) 50 MG tablet Take 50 mg by mouth daily.    Yes Historical Provider, MD    Inpatient Medications:  . [START ON 04/03/2016] aspirin  81 mg Oral Pre-Cath  . aspirin EC  325 mg Oral Daily  . metoprolol tartrate  25 mg Oral Q6H  . nitroGLYCERIN  1 inch Topical 4 times per day  . pantoprazole  40 mg Oral Daily  . potassium chloride  40 mEq Oral BID  . sodium chloride flush  3 mL Intravenous Q12H  . sodium chloride flush  3 mL  Intravenous Q12H  . traMADol  50 mg Oral Daily   . [START ON 04/03/2016] sodium chloride     Followed by  . [START ON 04/03/2016] sodium chloride    . heparin 1,100 Units/hr (04/01/16 2154)    Allergies:  Allergies  Allergen Reactions  . Fluticasone Swelling    Social History   Social History  . Marital Status: Married    Spouse Name: N/A  . Number of Children: N/A  . Years of Education: N/A   Occupational History  . Not on file.   Social History Main Topics  . Smoking status: Current Every Day Smoker -- 1.00 packs/day for 30 years    Types: Cigarettes  . Smokeless tobacco: Never Used  . Alcohol Use: Yes       Comment: occasional use  . Drug Use: No  . Sexual Activity: No   Other Topics Concern  . Not on file   Social History Narrative     Family History  Problem Relation Age of Onset  . Hypertension       Review of Systems: Review of Systems  Constitutional: Positive for malaise/fatigue. Negative for fever, chills, weight loss and diaphoresis.  HENT: Negative for congestion.   Eyes: Negative for discharge and redness.  Respiratory: Negative for cough, hemoptysis, sputum production, shortness of breath and wheezing.   Cardiovascular: Positive for chest pain. Negative for palpitations, orthopnea, claudication, leg swelling and PND.  Gastrointestinal: Positive for abdominal pain. Negative for heartburn, nausea, vomiting, blood in stool and melena.  Musculoskeletal: Negative for myalgias and falls.  Skin: Negative for rash.  Neurological: Positive for weakness. Negative for dizziness, tingling, tremors, sensory change, speech change, focal weakness, seizures and loss of consciousness.  Endo/Heme/Allergies: Does not bruise/bleed easily.  Psychiatric/Behavioral: Positive for substance abuse. Negative for suicidal ideas. The patient is not nervous/anxious.        Ongoing tobacco abuse   All other systems reviewed and are negative.   Labs:  Recent Labs  04/01/16 1649 04/01/16 1951  TROPONINI 0.04* 1.15*   Lab Results  Component Value Date   WBC 10.6 04/02/2016   HGB 15.6 04/02/2016   HCT 45.2 04/02/2016   MCV 87.4 04/02/2016   PLT 204 04/02/2016     Recent Labs Lab 04/02/16 0444  NA 136  K 3.6  CL 108  CO2 26  BUN 15  CREATININE 0.95  CALCIUM 8.3*  PROT 6.6  BILITOT 0.6  ALKPHOS 68  ALT 17  AST 93*  GLUCOSE 111*   Lab Results  Component Value Date   CHOL 199 04/01/2016   HDL 31* 04/01/2016   LDLCALC 153* 04/01/2016   TRIG 76 04/01/2016   No results found for: DDIMER  Radiology/Studies:  Ct Angio Chest Pe W/cm &/or Wo Cm  04/01/2016  CLINICAL DATA:   Acute onset of central chest pain. EXAM: CT ANGIOGRAPHY CHEST WITH CONTRAST TECHNIQUE: Multidetector CT imaging of the chest was performed using the standard protocol during bolus administration of intravenous contrast. Multiplanar CT image reconstructions and MIPs were obtained to evaluate the vascular anatomy. CONTRAST:  75 cc Isovue 370 COMPARISON:  None. FINDINGS: Mediastinum/Nodes: No chest wall mass, supraclavicular or axillary lymphadenopathy. The heart is normal in size. No pericardial effusion. The aorta is normal in caliber. No dissection. Scattered atherosclerotic calcifications at the aortic arch. The branch vessels are patent. The pulmonary arterial tree is well opacified. No filling defects to suggest pulmonary embolism. No mediastinal or hilar mass or adenopathy. Small scattered lymph  nodes are noted. Lungs/Pleura: Emphysematous changes and pulmonary scarring. There is dependent atelectasis/edema but no infiltrates, effusions or worrisome pulmonary lesions. Upper abdomen: No significant findings. Musculoskeletal: No significant findings. Moderate degenerative changes in the lower thoracic spine and scoliosis. Review of the MIP images confirms the above findings. IMPRESSION: 1. No CT findings for pulmonary embolism. 2. Mild atherosclerotic calcifications involving the aorta but no aneurysm or dissection. 3. Emphysematous changes and pulmonary scarring. Dependent atelectasis but no infiltrates or effusions. Electronically Signed   By: Rudie Meyer M.D.   On: 04/01/2016 19:14   Dg Chest Port 1 View  04/01/2016  CLINICAL DATA:  Central chest pain beginning this afternoon while doing yard work. EXAM: PORTABLE CHEST 1 VIEW COMPARISON:  None. FINDINGS: Lungs are adequately inflated without focal consolidation or effusion. Cardiomediastinal silhouette is within normal. Minimal degenerative change of the spine. IMPRESSION: No acute cardiopulmonary disease. Electronically Signed   By: Elberta Fortis M.D.    On: 04/01/2016 17:23    EKG: NSR, 70 bpm, rare PVC, 2 mm ST elevation in V2, 1 mm ST elevation in V3, inferior TWI  Weights: Filed Weights   04/01/16 2124  Weight: 200 lb (90.719 kg)     Physical Exam: Blood pressure 100/59, pulse 66, temperature 98.4 F (36.9 C), temperature source Oral, resp. rate 16, height  (1.753 m), weight 200 lb (90.719 kg), SpO2 91 %. Body mass index is 29.52 kg/(m^2). General: Well developed, well nourished, in no acute distress. Head: Normocephalic, atraumatic, sclera non-icteric, no xanthomas, nares are without discharge.  Neck: Negative for carotid bruits. JVD not elevated. Lungs: Clear bilaterally to auscultation without wheezes, rales, or rhonchi. Breathing is unlabored. Heart: RRR with S1 S2. No murmurs, rubs, or gallops appreciated. Abdomen: Soft, non-tender, non-distended with normoactive bowel sounds. No hepatomegaly. No rebound/guarding. No obvious abdominal masses. Msk:  Strength and tone appear normal for age. Extremities: No clubbing or cyanosis. No edema.  Distal pedal pulses are 2+ and equal bilaterally. Neuro: Alert and oriented X 3. No facial asymmetry. No focal deficit. Moves all extremities spontaneously. Psych:  Responds to questions appropriately with a normal affect.    Assessment and Plan:   1. NSTEMI: -Troponin at 1.15 at this time with EKG changes concerning for ischemia -Case discussed with Dr. Kirke Corin, MD. He is scheduled for cardiac cath at 8:30 AM this morning -Heparin gtt -Asprin, Lopressor for now, consider changing to Coreg, nitro patch -Add Lipitor -Check echo  2. HLD: -Add Lipitor as above  3. Hypokalemia: -Replete to 4.0  4. Tobacco abuse: -Cessation advised -Nicotine patch  5. Hyperglycemia: -Check A1C -Possible 2/2 #1  6. History of abdominal hernia: -Stable  7. Elevated blood pressure without dx of HTN: -Likely 2/2 #1 -Decrease Lopressor   Signed, Eula Listen, PA-C Pager: [phone] 04/02/2016, 8:32 AM

## 2016-04-02 NOTE — Progress Notes (Signed)
Stent card given to wife and patient

## 2016-04-02 NOTE — Progress Notes (Signed)
Pt. Arrived to unit via stretcher, pt. Walked to bed. Tele applied, NSR. VSS. Pt. A&O. 2nd verifier of tele box Lexi, Charity fundraiserN. Skin verified by Joycelyn SchmidLexi, RN. No skin issues noted. Heparin drip running at 11. General room orientation given, instruction on how to use ascom and call bell system given. Will continue to monitor pt.

## 2016-04-02 NOTE — Progress Notes (Signed)
Report to doll telemetry.  Keep right wrist elevated on pillow above the heart for today.  Watch right wrist for evidence of bleeding or hematoma.. If bleeding or hematoma noted, hold pressure over the site for at least 15 minutes and notify the physician.  No blending or flexing of the wrist--no lifting for the remainder of the day or for 2 weeks after your procedure. Notify the physician for evidence of infection or if you get a temperature.  

## 2016-04-02 NOTE — CV Procedure (Signed)
Cath done via right radial artery showed 95% mid RCA stenosis with large thrombus and occluded OM3. RCA PCI and DES placement was done without complications. Full report to follow .

## 2016-04-03 ENCOUNTER — Encounter: Payer: Self-pay | Admitting: Cardiovascular Disease

## 2016-04-03 ENCOUNTER — Inpatient Hospital Stay (HOSPITAL_COMMUNITY)
Admit: 2016-04-03 | Discharge: 2016-04-03 | Disposition: A | Discharge status: Home / Self Care | Payer: Managed Care, Other (non HMO) | Attending: Cardiovascular Disease | Admitting: Cardiovascular Disease

## 2016-04-03 DIAGNOSIS — R079 Chest pain, unspecified: Secondary | ICD-10-CM | POA: Insufficient documentation

## 2016-04-03 DIAGNOSIS — I10 Essential (primary) hypertension: Secondary | ICD-10-CM

## 2016-04-03 DIAGNOSIS — I209 Angina pectoris, unspecified: Secondary | ICD-10-CM

## 2016-04-03 LAB — CBC
HCT: 42.8 % (ref 40.0–52.0)
Hemoglobin: 14.9 g/dL (ref 13.0–18.0)
MCH: 30 pg (ref 26.0–34.0)
MCHC: 34.7 g/dL (ref 32.0–36.0)
MCV: 86.4 fL (ref 80.0–100.0)
Platelets: 186 10*3/uL (ref 150–440)
RBC: 4.96 MIL/uL (ref 4.40–5.90)
RDW: 13.3 % (ref 11.5–14.5)
WBC: 9.8 10*3/uL (ref 3.8–10.6)

## 2016-04-03 LAB — HEPATIC FUNCTION PANEL
ALBUMIN: 3.5 g/dL (ref 3.5–5.0)
ALT: 16 U/L — AB (ref 17–63)
AST: 79 U/L — ABNORMAL HIGH (ref 15–41)
Alkaline Phosphatase: 60 U/L (ref 38–126)
BILIRUBIN INDIRECT: 0.8 mg/dL (ref 0.3–0.9)
Bilirubin, Direct: 0.2 mg/dL (ref 0.1–0.5)
TOTAL PROTEIN: 6.5 g/dL (ref 6.5–8.1)
Total Bilirubin: 1 mg/dL (ref 0.3–1.2)

## 2016-04-03 LAB — BASIC METABOLIC PANEL
Anion gap: 5 (ref 5–15)
BUN: 11 mg/dL (ref 6–20)
CALCIUM: 8.5 mg/dL — AB (ref 8.9–10.3)
CHLORIDE: 112 mmol/L — AB (ref 101–111)
CO2: 19 mmol/L — ABNORMAL LOW (ref 22–32)
Creatinine, Ser: 0.81 mg/dL (ref 0.61–1.24)
GFR calc Af Amer: >60 mL/min (ref >60)
GFR calc non Af Amer: >60 mL/min (ref >60)
Glucose, Bld: 114 mg/dL — ABNORMAL HIGH (ref 65–99)
POTASSIUM: 3.5 mmol/L (ref 3.5–5.1)
Sodium: 136 mmol/L (ref 135–145)

## 2016-04-03 LAB — TROPONIN I: TROPONIN I: 16.91 ng/mL — AB (ref <0.031)

## 2016-04-03 MED ORDER — POTASSIUM CHLORIDE CRYS ER 20 MEQ PO TBCR
40.0000 meq | EXTENDED_RELEASE_TABLET | Freq: Once | ORAL | Status: AC
  Administered 2016-04-03: 40 meq via ORAL
  Filled 2016-04-03: qty 2

## 2016-04-03 NOTE — Progress Notes (Signed)
Ambulated around the nurses station and tolearted it well. Pt reports no pain and refuses Morphine. Room air. NSR. Pt has no further concerns at this time.

## 2016-04-03 NOTE — Progress Notes (Signed)
[employer] - [place] at [facility]   PATIENT NAME: Jesse Soto    MR#:  [id number]  DATE OF BIRTH:  [birth year]  SUBJECTIVE:  CHIEF COMPLAINT:   Chief Complaint  Patient presents with  . Chest Pain     Had cardiac cath done- showed a thrombus on RCA- stent placed. He again had pain after eating after cath, transfer to stepdown. No more chest pain, troponin very high this morning.  As it was a complicated CAD- cardio suggested to keep one more night here.  REVIEW OF SYSTEMS:  CONSTITUTIONAL: No fever, fatigue or weakness.  EYES: No blurred or double vision.  EARS, NOSE, AND THROAT: No tinnitus or ear pain.  RESPIRATORY: No cough, shortness of breath, wheezing or hemoptysis.  CARDIOVASCULAR: positive for chest pain,no orthopnea, edema.  GASTROINTESTINAL: No nausea, vomiting, diarrhea or abdominal pain.  GENITOURINARY: No dysuria, hematuria.  ENDOCRINE: No polyuria, nocturia,  HEMATOLOGY: No anemia, easy bruising or bleeding SKIN: No rash or lesion. MUSCULOSKELETAL: No joint pain or arthritis.   NEUROLOGIC: No tingling, numbness, weakness.  PSYCHIATRY: No anxiety or depression.   ROS  DRUG ALLERGIES:   Allergies  Allergen Reactions  . Fluticasone Swelling    VITALS:  Blood pressure 118/65, pulse 57, temperature 98 F (36.7 C), temperature source Oral, resp. rate 22, height  (1.753 m), weight 83.6 kg (184 lb 4.9 oz), SpO2 96 %.  PHYSICAL EXAMINATION:  GENERAL:  61 y.o.-year-old patient lying in the bed with no acute distress.  EYES: Pupils equal, round, reactive to light and accommodation. No scleral icterus. Extraocular muscles intact.  HEENT: Head atraumatic, normocephalic. Oropharynx and nasopharynx clear.  NECK:  Supple, no jugular venous distention. No thyroid enlargement, no tenderness.  LUNGS: Normal breath sounds bilaterally, no wheezing, rales,rhonchi or crepitation. No use of accessory muscles of respiration.  CARDIOVASCULAR: S1, S2  normal. No murmurs, rubs, or gallops.  ABDOMEN: Soft, nontender, nondistended. Bowel sounds present. No organomegaly or mass.  EXTREMITIES: No pedal edema, cyanosis, or clubbing.  NEUROLOGIC: Cranial nerves II through XII are intact. Muscle strength 5/5 in all extremities. Sensation intact. Gait not checked.  PSYCHIATRIC: The patient is alert and oriented x 3.  SKIN: No obvious rash, lesion, or ulcer.   Physical Exam LABORATORY PANEL:   CBC  Recent Labs Lab 04/03/16 0421  WBC 9.8  HGB 14.9  HCT 42.8  PLT 186   ------------------------------------------------------------------------------------------------------------------  Chemistries   Recent Labs Lab 04/01/16 1649  04/03/16 0421  NA 136  < > 136  K 3.1*  < > 3.5  CL 104  < > 112*  CO2 23  < > 19*  GLUCOSE 127*  < > 114*  BUN 13  < > 11  CREATININE 1.12  < > 0.81  CALCIUM 9.4  < > 8.5*  MG 2.0  --   --   AST 28  < > 79*  ALT 14*  < > 16*  ALKPHOS 82  < > 60  BILITOT 0.7  < > 1.0  < > = values in this interval not displayed. ------------------------------------------------------------------------------------------------------------------  Cardiac Enzymes  Recent Labs Lab 04/01/16 1951 04/03/16 0421  TROPONINI 1.15* 16.91*   ------------------------------------------------------------------------------------------------------------------  RADIOLOGY:  Ct Angio Chest Pe W/cm &/or Wo Cm  04/01/2016  CLINICAL DATA:  Acute onset of central chest pain. EXAM: CT ANGIOGRAPHY CHEST WITH CONTRAST TECHNIQUE: Multidetector CT imaging of the chest was performed using the standard protocol during bolus administration of intravenous contrast. Multiplanar CT image reconstructions  and MIPs were obtained to evaluate the vascular anatomy. CONTRAST:  75 cc Isovue 370 COMPARISON:  None. FINDINGS: Mediastinum/Nodes: No chest wall mass, supraclavicular or axillary lymphadenopathy. The heart is normal in size. No pericardial effusion.  The aorta is normal in caliber. No dissection. Scattered atherosclerotic calcifications at the aortic arch. The branch vessels are patent. The pulmonary arterial tree is well opacified. No filling defects to suggest pulmonary embolism. No mediastinal or hilar mass or adenopathy. Small scattered lymph nodes are noted. Lungs/Pleura: Emphysematous changes and pulmonary scarring. There is dependent atelectasis/edema but no infiltrates, effusions or worrisome pulmonary lesions. Upper abdomen: No significant findings. Musculoskeletal: No significant findings. Moderate degenerative changes in the lower thoracic spine and scoliosis. Review of the MIP images confirms the above findings. IMPRESSION: 1. No CT findings for pulmonary embolism. 2. Mild atherosclerotic calcifications involving the aorta but no aneurysm or dissection. 3. Emphysematous changes and pulmonary scarring. Dependent atelectasis but no infiltrates or effusions. Electronically Signed   By: Rudie MeyerP.  Gallerani M.D.   On: 04/01/2016 19:14    ASSESSMENT AND PLAN:   Active Problems:   Acute MI, subendocardial, initial episode of care (HCC)   HTN (hypertension)   Subendocardial MI first episode care [facility](HCC)   Chest pain  #1 Acute MI, subendocardial,   Cardiac cath done- RCA DES placed.   Still had epigastric pain- move to stepdown unit. Monitor.   Checked Lipid panel. LDL high- added atorvastatin.   Appreciated cardio help.   Due to persistent and higher troponin- cardio suggested to keep in hospital one more night. #2. Elevated blood pressure without known history of Hypertension,   on metoprolol, stable. #3. Leukocytosis, unclear etiology #4. Hypokalemia, supplement orally, normal magnesium level      Potassium normal now. #5. Hyperglycemia, normal hemoglobin A1c #6. Epigastric abdominal pain, questionable gastritis, initiate patient on Protonix #7 tobacco abuse counseling. Discussed this patient for approximately 5 minutes, nicotine  replacement therapy    All the records are reviewed and case discussed with Care Management/Social Workerr. Management plans discussed with the patient, family and they are in agreement.  CODE STATUS: Full  TOTAL TIME TAKING CARE OF THIS PATIENT: 35 minutes.  Discussed with his wife in room,and cardiologist.  POSSIBLE D/C IN 1-2 DAYS, DEPENDING ON CLINICAL CONDITION.   Altamese DillingVACHHANI, Loreena Valeri M.D on 04/03/2016   Between 7am to 6pm - Pager - [phone]  After 6pm go to [email or web address] - Social research officer, governmentpassword EPAS [facility]  [facility]  Office  [phone]  CC: Primary care physician; No primary care provider on file.  Note: This dictation was prepared with Dragon dictation along with smaller phrase technology. Any transcriptional errors that result from this process are unintentional.

## 2016-04-03 NOTE — Progress Notes (Signed)
Patient: Jesse Soto / Admit Date: 04/01/2016 / Date of Encounter: 04/03/2016, 7:39 AM   Subjective: No further chest pain. Has ambulated in his room without issues. Plans to quit smoking on his own, does not want help from [place] at this time. Labs pending this AM, troponin.   Review of Systems: Review of Systems  Constitutional: Negative.  Negative for fever, chills, weight loss, malaise/fatigue and diaphoresis.  HENT: Negative for congestion.   Eyes: Negative for discharge and redness.  Respiratory: Negative.  Negative for cough, hemoptysis, sputum production, shortness of breath and wheezing.   Cardiovascular: Negative.  Negative for chest pain, palpitations, orthopnea, claudication, leg swelling and PND.  Gastrointestinal: Negative.  Negative for nausea, vomiting and abdominal pain.  Musculoskeletal: Negative.  Negative for myalgias and falls.  Skin: Negative for rash.  Neurological: Negative.  Negative for dizziness, sensory change, speech change, focal weakness, loss of consciousness and weakness.  Endo/Heme/Allergies: Does not bruise/bleed easily.  Psychiatric/Behavioral: Negative.  Negative for substance abuse. The patient is not nervous/anxious.   All other systems reviewed and are negative.   Objective: Telemetry: sinus bradycardia, 40's to 50's Physical Exam: Blood pressure 107/65, pulse 58, temperature 96.6 F (35.9 C), temperature source Oral, resp. rate 17, height  (1.753 m), weight 184 lb 4.9 oz (83.6 kg), SpO2 98 %. Body mass index is 27.2 kg/(m^2). General: Well developed, well nourished, in no acute distress. Head: Normocephalic, atraumatic, sclera non-icteric, no xanthomas, nares are without discharge. Neck: Negative for carotid bruits. JVP not elevated. Lungs: Clear bilaterally to auscultation without wheezes, rales, or rhonchi. Breathing is unlabored. Heart: Bradycardic S1 S2 without murmurs, rubs, or gallops.  Abdomen: Soft, non-tender, non-distended with  normoactive bowel sounds. No rebound/guarding. Extremities: No clubbing or cyanosis. No edema. Distal pedal pulses are 2+ and equal bilaterally. Cath site without bleeding, bruising, swelling, erythema, or TTP. Distal pulse 2+.  Neuro: Alert and oriented X 3. Moves all extremities spontaneously. Psych:  Responds to questions appropriately with a normal affect.   Intake/Output Summary (Last 24 hours) at 04/03/16 0739 Last data filed at 04/03/16 0557  Gross per 24 hour  Intake  78.61 ml  Output    600 ml  Net -521.39 ml    Inpatient Medications:  . aspirin  81 mg Oral Daily  . atorvastatin  80 mg Oral q1800  . metoprolol tartrate  25 mg Oral BID  .  morphine injection  2 mg Intravenous Q4H  . nitroGLYCERIN  1 inch Topical 4 times per day  . pantoprazole  40 mg Oral Daily  . sodium chloride flush  3 mL Intravenous Q12H  . sodium chloride flush  3 mL Intravenous Q12H  . ticagrelor  90 mg Oral BID  . traMADol  50 mg Oral Daily   Infusions:    Labs:  Recent Labs  04/01/16 1649 04/02/16 0444 04/03/16 0421  NA 136 136 136  K 3.1* 3.6 3.5  CL 104 108 112*  CO2 23 26 19*  GLUCOSE 127* 111* 114*  BUN CREATININE 1.12 0.95 0.81  CALCIUM 9.4 8.3* 8.5*  MG 2.0  --   --     Recent Labs  04/02/16 0444 04/03/16 0421  AST 93* 79*  ALT 17 16*  ALKPHOS 68 60  BILITOT 0.6 1.0  PROT 6.6 6.5  ALBUMIN 3.7 3.5    Recent Labs  04/01/16 1649 04/02/16 0444 04/03/16 0421  WBC 10.8* 10.6 9.8  NEUTROABS 7.4*  --   --  HGB 17.8 15.6 14.9  HCT 52.0 45.2 42.8  MCV 87.9 87.4 86.4  PLT 240 204 186    Recent Labs  04/01/16 1649 04/01/16 1951  TROPONINI 0.04* 1.15*   Invalid input(s): POCBNP  Recent Labs  04/01/16 1649  HGBA1C 5.7    Cardiac cath 04/02/2016: Conclusion     Mid RCA lesion, 60% stenosed.  Dist Cx lesion, 100% stenosed.  Prox LAD to Mid LAD lesion, 60% stenosed.  1st Diag lesion, 40% stenosed.  There is mild left ventricular systolic  dysfunction.  Prox RCA to Mid RCA lesion, 95% stenosed. Post intervention, there is a 0% residual stenosis. The lesion was not previously treated.  1. Severe two-vessel coronary artery disease. The culprit for myocardial infarction is 95% stenosis in the proximal to midright coronary artery with large thrombus. The distal left circumflex seems to be chronically occluded supplying OM 3 with collaterals. Moderate mid LAD and mid RCA disease as well.   2. Mildly reduced LV systolic function with an ejection fraction of 45%. Normal left ventricular end-diastolic pressure. 3. Successful aspiration thrombectomy and drug-eluting stent placement to the right coronary artery. This was complicated by embolization into distal PL 1 which gradually improved with subsequent injections.  Recommendations: Dual antiplatelet therapy for at least one year. Continue Aggrastat infusion for 12 hours. Smoking cessation is advised.      Weights: Filed Weights   04/01/16 2124 04/02/16 1821  Weight: 200 lb (90.719 kg) 184 lb 4.9 oz (83.6 kg)     Radiology/Studies:  Ct Angio Chest Pe W/cm &/or Wo Cm  04/01/2016  CLINICAL DATA:  Acute onset of central chest pain. EXAM: CT ANGIOGRAPHY CHEST WITH CONTRAST TECHNIQUE: Multidetector CT imaging of the chest was performed using the standard protocol during bolus administration of intravenous contrast. Multiplanar CT image reconstructions and MIPs were obtained to evaluate the vascular anatomy. CONTRAST:  75 cc Isovue 370 COMPARISON:  None. FINDINGS: Mediastinum/Nodes: No chest wall mass, supraclavicular or axillary lymphadenopathy. The heart is normal in size. No pericardial effusion. The aorta is normal in caliber. No dissection. Scattered atherosclerotic calcifications at the aortic arch. The branch vessels are patent. The pulmonary arterial tree is well opacified. No filling defects to suggest pulmonary embolism. No mediastinal or hilar mass or adenopathy. Small scattered  lymph nodes are noted. Lungs/Pleura: Emphysematous changes and pulmonary scarring. There is dependent atelectasis/edema but no infiltrates, effusions or worrisome pulmonary lesions. Upper abdomen: No significant findings. Musculoskeletal: No significant findings. Moderate degenerative changes in the lower thoracic spine and scoliosis. Review of the MIP images confirms the above findings. IMPRESSION: 1. No CT findings for pulmonary embolism. 2. Mild atherosclerotic calcifications involving the aorta but no aneurysm or dissection. 3. Emphysematous changes and pulmonary scarring. Dependent atelectasis but no infiltrates or effusions. Electronically Signed   By: Rudie Meyer M.D.   On: 04/01/2016 19:14   Dg Chest Port 1 View  04/01/2016  CLINICAL DATA:  Central chest pain beginning this afternoon while doing yard work. EXAM: PORTABLE CHEST 1 VIEW COMPARISON:  None. FINDINGS: Lungs are adequately inflated without focal consolidation or effusion. Cardiomediastinal silhouette is within normal. Minimal degenerative change of the spine. IMPRESSION: No acute cardiopulmonary disease. Electronically Signed   By: Elberta Fortis M.D.   On: 04/01/2016 17:23     Assessment and Plan   1. NSTEMI/CAD: -Status post cardiac cath that showed severe two-vessel CAD. The culprit for MI was 95% stenosis in the proximal to mid RCA with large thrombus. The distal LCx  seemed to be chronically occluded supplying OM3 with collaterals. There was moderate mid LAD and mid RCA disease as well. He underwent successful PCI/DES of the proximal to mid RCA with 0% residual stenosis with successful aspiration thrombectomy in the RCA. The procedure was complicated by distal embolization into the distal PL1 which gradually improved with subsequent injections. Mildly reduced LV systolic function with EF of 45%, normal LVEDP. He did have recurrent chest pain s/p PCI in 2A s/p eating requiring morphine x 1. EKG improved from priors. No further chest  pain since being moved to step down.  -Recheck troponin this morning, if up trending would need to stay another day, also need patient to ambulate in the hallway -Echo is pending to assess EF and wall motion -DAPT for at least 12 months with aspirin 81 mg daily and Brilinta 90 mg bid, will consult case manager for assistance with Brilinta  -Lopressor 25 mg bid, Lipitor 80 mg, SL NTG prn -Cardiac rehab -Smoking cessation advised   2. HLD: -Lipitor 80 mg -LDL 153, goal <70  3. Hypokalemia: -Replete to 4.0  4. Tobacco abuse: -Cessation advised -He declines help with this at this time -Advised patient he can call our office for help at anytime  5. Hyperglycemia: -A1C 5.7%  6. History of abdominal hernia: -Stable  7. Elevated blood pressure without dx of HTN: -Improved   Signed, Eula ListenRyan Dunn, PA-C Pager: [phone] 04/03/2016, 7:39 AM    Attending Note Patient seen and examined, agree with detailed note above,  Patient presentation and plan discussed on rounds.   No complaints this morning, discussed yesterday's results with patient and his wife More than 4 minutes spent discussing smoking cessation and techniques He does not want assistance with Chantix at this time, prefers to quit cold [place]  Exam essentially benign, heart sounds regular, lungs clear, no edema as above  no significant arrhythmia on telemetry  Troponin pending from this morning Would move to 2A, ambulates If significant climb in his troponin, could keep as inpatient overnight Appears somewhat  Anxious Currently on aspirin, statin, beta blocker, brilinta BID   Signed: Dossie Arbourim Gollan  M.D., Ph.D. [facility]

## 2016-04-03 NOTE — Care Management (Signed)
Patient admitted with acute mi.  He had cardiac cath with PCI.  To discharge home on brilinta.  Provided him with coupon.  Uses [facility] and his pcp is Dr Joycelyn ManZimmerman in [place].  It is more convenient to see that physician as it is near his work.  Independent in all adls, denies issues accessing medical care, obtaining medications or with transportation.  Current with her PCP.  No  other discharge needs identified at present by care manager or members of care team other than need for coupon

## 2016-04-03 NOTE — Progress Notes (Signed)
[facility] [place] [facility]   PATIENT NAME: Jesse PollRicky Gehret    MR#:  [id number]  DATE OF BIRTH:  March 20, 1955  SUBJECTIVE:  CHIEF COMPLAINT:   Chief Complaint  Patient presents with  . Chest Pain     Had cardiac cath done- showed a thrombus on RCA- stent placed. He again had pain after eating after cath, transfer to stepdown.  REVIEW OF SYSTEMS:  CONSTITUTIONAL: No fever, fatigue or weakness.  EYES: No blurred or double vision.  EARS, NOSE, AND THROAT: No tinnitus or ear pain.  RESPIRATORY: No cough, shortness of breath, wheezing or hemoptysis.  CARDIOVASCULAR: positive for chest pain,no orthopnea, edema.  GASTROINTESTINAL: No nausea, vomiting, diarrhea or abdominal pain.  GENITOURINARY: No dysuria, hematuria.  ENDOCRINE: No polyuria, nocturia,  HEMATOLOGY: No anemia, easy bruising or bleeding SKIN: No rash or lesion. MUSCULOSKELETAL: No joint pain or arthritis.   NEUROLOGIC: No tingling, numbness, weakness.  PSYCHIATRY: No anxiety or depression.   ROS  DRUG ALLERGIES:   Allergies  Allergen Reactions  . Fluticasone Swelling    VITALS:  Blood pressure 101/61, pulse 65, temperature 98 F (36.7 C), temperature source Oral, resp. rate 22, height 5\' 9"  (1.753 m), weight 83.6 kg (184 lb 4.9 oz), SpO2 96 %.  PHYSICAL EXAMINATION:  GENERAL:  61 y.o.-year-old patient lying in the bed with no acute distress.  EYES: Pupils equal, round, reactive to light and accommodation. No scleral icterus. Extraocular muscles intact.  HEENT: Head atraumatic, normocephalic. Oropharynx and nasopharynx clear.  NECK:  Supple, no jugular venous distention. No thyroid enlargement, no tenderness.  LUNGS: Normal breath sounds bilaterally, no wheezing, rales,rhonchi or crepitation. No use of accessory muscles of respiration.  CARDIOVASCULAR: S1, S2 normal. No murmurs, rubs, or gallops.  ABDOMEN: Soft, nontender, nondistended. Bowel sounds present. No organomegaly or mass.   EXTREMITIES: No pedal edema, cyanosis, or clubbing.  NEUROLOGIC: Cranial nerves II through XII are intact. Muscle strength 5/5 in all extremities. Sensation intact. Gait not checked.  PSYCHIATRIC: The patient is alert and oriented x 3.  SKIN: No obvious rash, lesion, or ulcer.   Physical Exam LABORATORY PANEL:   CBC  Recent Labs Lab 04/03/16 0421  WBC 9.8  HGB 14.9  HCT 42.8  PLT 186   ------------------------------------------------------------------------------------------------------------------  Chemistries   Recent Labs Lab 04/01/16 1649  04/03/16 0421  NA 136  < > 136  K 3.1*  < > 3.5  CL 104  < > 112*  CO2 23  < > 19*  GLUCOSE 127*  < > 114*  BUN 13  < > 11  CREATININE 1.12  < > 0.81  CALCIUM 9.4  < > 8.5*  MG 2.0  --   --   AST 28  < > 79*  ALT 14*  < > 16*  ALKPHOS 82  < > 60  BILITOT 0.7  < > 1.0  < > = values in this interval not displayed. ------------------------------------------------------------------------------------------------------------------  Cardiac Enzymes  Recent Labs Lab 04/01/16 1951 04/03/16 0421  TROPONINI 1.15* 16.91*   ------------------------------------------------------------------------------------------------------------------  RADIOLOGY:  Ct Angio Chest Pe W/cm &/or Wo Cm  04/01/2016  CLINICAL DATA:  Acute onset of central chest pain. EXAM: CT ANGIOGRAPHY CHEST WITH CONTRAST TECHNIQUE: Multidetector CT imaging of the chest was performed using the standard protocol during bolus administration of intravenous contrast. Multiplanar CT image reconstructions and MIPs were obtained to evaluate the vascular anatomy. CONTRAST:  75 cc Isovue 370 COMPARISON:  None. FINDINGS: Mediastinum/Nodes: No chest wall mass,  supraclavicular or axillary lymphadenopathy. The heart is normal in size. No pericardial effusion. The aorta is normal in caliber. No dissection. Scattered atherosclerotic calcifications at the aortic arch. The branch vessels  are patent. The pulmonary arterial tree is well opacified. No filling defects to suggest pulmonary embolism. No mediastinal or hilar mass or adenopathy. Small scattered lymph nodes are noted. Lungs/Pleura: Emphysematous changes and pulmonary scarring. There is dependent atelectasis/edema but no infiltrates, effusions or worrisome pulmonary lesions. Upper abdomen: No significant findings. Musculoskeletal: No significant findings. Moderate degenerative changes in the lower thoracic spine and scoliosis. Review of the MIP images confirms the above findings. IMPRESSION: 1. No CT findings for pulmonary embolism. 2. Mild atherosclerotic calcifications involving the aorta but no aneurysm or dissection. 3. Emphysematous changes and pulmonary scarring. Dependent atelectasis but no infiltrates or effusions. Electronically Signed   By: Rudie Meyer M.D.   On: 04/01/2016 19:14   Dg Chest Port 1 View  04/01/2016  CLINICAL DATA:  Central chest pain beginning this afternoon while doing yard work. EXAM: PORTABLE CHEST 1 VIEW COMPARISON:  None. FINDINGS: Lungs are adequately inflated without focal consolidation or effusion. Cardiomediastinal silhouette is within normal. Minimal degenerative change of the spine. IMPRESSION: No acute cardiopulmonary disease. Electronically Signed   By: Elberta Fortis M.D.   On: 04/01/2016 17:23    ASSESSMENT AND PLAN:   Active Problems:   Acute MI, subendocardial, initial episode of care (HCC)   HTN (hypertension)   Subendocardial MI first episode care [facility])   Chest pain  #1 Acute MI, subendocardial,   Cardiac cath done- RCA DES placed.   Still had epigastric pain- move to stepdown unit. Monitor.   Checked Lipid panel. LDL high- added atorvastatin.   Appreciated cardio help. #2. Elevated blood pressure without known history of Hypertension,   on metoprolol, stable. #3. Leukocytosis, unclear etiology #4. Hypokalemia, supplement orally, normal magnesium level      Potassium normal  now. #5. Hyperglycemia, normal hemoglobin A1c #6. Epigastric abdominal pain, questionable gastritis, initiate patient on Protonix #7 tobacco abuse counseling. Discussed this patient for approximately 5 minutes, nicotine replacement therapy will be initiated, patient is agreeable   All the records are reviewed and case discussed with Care Management/Social Workerr. Management plans discussed with the patient, family and they are in agreement.  CODE STATUS: Full  TOTAL TIME TAKING CARE OF THIS PATIENT: 35 minutes.  Discussed with his wife in room,and cardiologist.  POSSIBLE D/C IN 1-2 DAYS, DEPENDING ON CLINICAL CONDITION.   Altamese Dilling M.D on 04/03/2016   Between 7am to 6pm - Pager - [phone]  After 6pm go to [email or web address] - Social research officer, government  [facility]  Office  [phone]  CC: Primary care physician; No primary care provider on file.  Note: This dictation was prepared with Dragon dictation along with smaller phrase technology. Any transcriptional errors that result from this process are unintentional.

## 2016-04-03 NOTE — Progress Notes (Signed)
Updated Dr. Mariah MillingGollan of patient's troponin level of 16.91. No new orders.

## 2016-04-04 ENCOUNTER — Telehealth: Payer: Self-pay | Admitting: Cardiovascular Disease

## 2016-04-04 DIAGNOSIS — E785 Hyperlipidemia, unspecified: Secondary | ICD-10-CM

## 2016-04-04 LAB — ECHOCARDIOGRAM COMPLETE
Height: 69 in
WEIGHTICAEL: 2948.87 [oz_av]

## 2016-04-04 LAB — GLUCOSE, CAPILLARY: GLUCOSE-CAPILLARY: 145 mg/dL — AB (ref 65–99)

## 2016-04-04 MED ORDER — METOPROLOL TARTRATE 25 MG PO TABS
25.0000 mg | ORAL_TABLET | Freq: Two times a day (BID) | ORAL | Status: DC
Start: 2016-04-04 — End: 2016-04-12

## 2016-04-04 MED ORDER — ALUM & MAG HYDROXIDE-SIMETH 200-200-20 MG/5ML PO SUSP
15.0000 mL | Freq: Four times a day (QID) | ORAL | Status: DC | PRN
  Administered 2016-04-04: 15 mL via ORAL
  Filled 2016-04-04 (×2): qty 30

## 2016-04-04 MED ORDER — ATORVASTATIN CALCIUM 80 MG PO TABS: 80.0000 mg | ORAL_TABLET | Freq: Every day | ORAL | Status: DC

## 2016-04-04 MED ORDER — PANTOPRAZOLE SODIUM 40 MG PO TBEC: 40.0000 mg | DELAYED_RELEASE_TABLET | Freq: Every day | ORAL | Status: DC

## 2016-04-04 MED ORDER — ASPIRIN 81 MG PO CHEW: 81.0000 mg | CHEWABLE_TABLET | Freq: Every day | ORAL | Status: AC

## 2016-04-04 MED ORDER — TICAGRELOR 90 MG PO TABS
90.0000 mg | ORAL_TABLET | Freq: Two times a day (BID) | ORAL | Status: DC
Start: 2016-04-04 — End: 2016-04-12

## 2016-04-04 NOTE — Progress Notes (Signed)
Patient: Jesse Soto / Admit Date: 04/01/2016 / Date of Encounter: 04/04/2016, 10:45 AM   Subjective: No acute overnight events. Some reflux that was relieved with Maalox. No further chest pain. Has ambulated in the hallway without any issues. Toelrating medications without any issues. Still plans on quitting smoking on his own.     Review of Systems: Review of Systems  Constitutional: Negative for fever, chills, weight loss, malaise/fatigue and diaphoresis.  HENT: Negative for congestion.   Eyes: Negative for discharge and redness.  Respiratory: Negative for cough, hemoptysis, sputum production, shortness of breath and wheezing.   Cardiovascular: Negative for chest pain, palpitations, orthopnea, claudication, leg swelling and PND.  Gastrointestinal: Positive for heartburn. Negative for nausea, vomiting and abdominal pain.  Musculoskeletal: Negative for myalgias and falls.  Skin: Negative for rash.  Neurological: Negative for dizziness, tingling, tremors, sensory change, speech change, focal weakness, loss of consciousness and weakness.  Endo/Heme/Allergies: Does not bruise/bleed easily.  Psychiatric/Behavioral: The patient is nervous/anxious.   All other systems reviewed and are negative.    Objective: Telemetry: NSR, 70's Physical Exam: Blood pressure 116/62, pulse 66, temperature 97.4 F (36.3 C), temperature source Oral, resp. rate 20, height  (1.753 m), weight 184 lb 4.9 oz (83.6 kg), SpO2 98 %. Body mass index is 27.2 kg/(m^2). General: Well developed, well nourished, in no acute distress. Head: Normocephalic, atraumatic, sclera non-icteric, no xanthomas, nares are without discharge. Neck: Negative for carotid bruits. JVP not elevated. Lungs: Clear bilaterally to auscultation without wheezes, rales, or rhonchi. Breathing is unlabored. Heart: RRR S1 S2 without murmurs, rubs, or gallops.  Abdomen: Soft, non-tender, non-distended with normoactive bowel sounds. No  rebound/guarding. Extremities: No clubbing or cyanosis. No edema. Distal pedal pulses are 2+ and equal bilaterally. Cath site without bleeding, bruising, swelling, TTP, or erythema. Distal pulses 2+.  Neuro: Alert and oriented X 3. Moves all extremities spontaneously. Psych:  Responds to questions appropriately with a normal affect.   Intake/Output Summary (Last 24 hours) at 04/04/16 1045 Last data filed at 04/04/16 0336  Gross per 24 hour  Intake    600 ml  Output    650 ml  Net    -50 ml    Inpatient Medications:  . aspirin  81 mg Oral Daily  . atorvastatin  80 mg Oral q1800  . metoprolol tartrate  25 mg Oral BID  .  morphine injection  2 mg Intravenous Q4H  . nitroGLYCERIN  1 inch Topical 4 times per day  . pantoprazole  40 mg Oral Daily  . sodium chloride flush  3 mL Intravenous Q12H  . sodium chloride flush  3 mL Intravenous Q12H  . ticagrelor  90 mg Oral BID  . traMADol  50 mg Oral Daily   Infusions:    Labs:  Recent Labs  04/01/16 1649 04/02/16 0444 04/03/16 0421  NA 136 136 136  K 3.1* 3.6 3.5  CL 104 108 112*  CO2 23 26 19*  GLUCOSE 127* 111* 114*  BUN CREATININE 1.12 0.95 0.81  CALCIUM 9.4 8.3* 8.5*  MG 2.0  --   --     Recent Labs  04/02/16 0444 04/03/16 0421  AST 93* 79*  ALT 17 16*  ALKPHOS 68 60  BILITOT 0.6 1.0  PROT 6.6 6.5  ALBUMIN 3.7 3.5    Recent Labs  04/01/16 1649 04/02/16 0444 04/03/16 0421  WBC 10.8* 10.6 9.8  NEUTROABS 7.4*  --   --   HGB  17.8 15.6 14.9  HCT 52.0 45.2 42.8  MCV 87.9 87.4 86.4  PLT 240 204 186    Recent Labs  04/01/16 1649 04/01/16 1951 04/03/16 0421  TROPONINI 0.04* 1.15* 16.91*   Invalid input(s): POCBNP  Recent Labs  04/01/16 1649  HGBA1C 5.7     Weights: Filed Weights   04/01/16 2124 04/02/16 1821  Weight: 200 lb (90.719 kg) 184 lb 4.9 oz (83.6 kg)     Radiology/Studies:  Ct Angio Chest Pe W/cm &/or Wo Cm  04/01/2016  CLINICAL DATA:  Acute onset of central chest pain.  EXAM: CT ANGIOGRAPHY CHEST WITH CONTRAST TECHNIQUE: Multidetector CT imaging of the chest was performed using the standard protocol during bolus administration of intravenous contrast. Multiplanar CT image reconstructions and MIPs were obtained to evaluate the vascular anatomy. CONTRAST:  75 cc Isovue 370 COMPARISON:  None. FINDINGS: Mediastinum/Nodes: No chest wall mass, supraclavicular or axillary lymphadenopathy. The heart is normal in size. No pericardial effusion. The aorta is normal in caliber. No dissection. Scattered atherosclerotic calcifications at the aortic arch. The branch vessels are patent. The pulmonary arterial tree is well opacified. No filling defects to suggest pulmonary embolism. No mediastinal or hilar mass or adenopathy. Small scattered lymph nodes are noted. Lungs/Pleura: Emphysematous changes and pulmonary scarring. There is dependent atelectasis/edema but no infiltrates, effusions or worrisome pulmonary lesions. Upper abdomen: No significant findings. Musculoskeletal: No significant findings. Moderate degenerative changes in the lower thoracic spine and scoliosis. Review of the MIP images confirms the above findings. IMPRESSION: 1. No CT findings for pulmonary embolism. 2. Mild atherosclerotic calcifications involving the aorta but no aneurysm or dissection. 3. Emphysematous changes and pulmonary scarring. Dependent atelectasis but no infiltrates or effusions. Electronically Signed   By: Rudie MeyerP.  Gallerani M.D.   On: 04/01/2016 19:14   Dg Chest Port 1 View  04/01/2016  CLINICAL DATA:  Central chest pain beginning this afternoon while doing yard work. EXAM: PORTABLE CHEST 1 VIEW COMPARISON:  None. FINDINGS: Lungs are adequately inflated without focal consolidation or effusion. Cardiomediastinal silhouette is within normal. Minimal degenerative change of the spine. IMPRESSION: No acute cardiopulmonary disease. Electronically Signed   By: Elberta Fortisaniel  Boyle M.D.   On: 04/01/2016 17:23      Assessment and Plan   1. NSTEMI/CAD: -Status post cardiac cath that showed severe two-vessel CAD. The culprit for MI was 95% stenosis in the proximal to mid RCA with large thrombus. The distal LCx seemed to be chronically occluded supplying OM3 with collaterals. There was moderate mid LAD and mid RCA disease as well. He underwent successful PCI/DES of the proximal to mid RCA with 0% residual stenosis with successful aspiration thrombectomy in the RCA. The procedure was complicated by distal embolization into the distal PL1 which gradually improved with subsequent injections. Mildly reduced LV systolic function with EF of 45%, normal LVEDP. He did have recurrent chest pain s/p PCI in 2A s/p eating requiring morphine x 1. EKG improved from priors. No further chest pain since being moved to step down.  -Continues without chest pain this admission -Has ambulated in the hallway without any symptoms -Echo is pending to assess EF and wall motion -DAPT for at least 12 months with aspirin 81 mg daily and Brilinta 90 mg bid, will consult case manager for assistance with Brilinta  -Lopressor 25 mg bid, Lipitor 80 mg, SL NTG prn -Cardiac rehab -Smoking cessation advised   2. HLD: -Lipitor 80 mg -LDL 153, goal <70  3. Hypokalemia: -Replete to 4.0  4.  Tobacco abuse: -Cessation advised -He declines help with this at this time -Advised patient he can call our office for help at anytime  5. Hyperglycemia: -A1C 5.7%  6. History of abdominal hernia: -Stable  7. Elevated blood pressure without dx of HTN: -Improved  8. Dispo: -Ok for discharge once seen by Dr. Herbie Baltimore, MD if he agrees -Will need follow up in our office in 7-10 days   Signed, Eula Listen, PA-C Pager: [phone] 04/04/2016, 10:45 AM  I have seen, examined and evaluated the patient this morning along with Mr. Jetta Lout.  After reviewing all the available data and chart,  I agree with his findings, examination as well as  impression recommendations.  Patient looks and feels great now day 2 post PCI in setting of non-STEMI. No recurrent anginal heart failure symptoms. He is on dual antiplatelet therapy, high-dose statin, and low-dose Lopressor.    He is ambulating without difficulty, and is stable for discharge. Will need close follow-up. I suspect he should return to work in 3 weeks discretion of the discharging physician. He should be seen in our clinic prior to that.     Marykay Lex, M.D., M.S. Interventional Cardiologist   Pager # [phone] Phone # [phone] [address] [place], [place] [address]

## 2016-04-04 NOTE — Progress Notes (Signed)
Patient given discharge teaching and paperwork regarding medications, diet, follow-up appointments and activity. Patient understanding verbalized. No complaints at this time. . IV and telemetry discontinued prior to leaving. Skin assessment as previously charted and vitals are stable; on room air. Patient being discharged to home. Caregiver/family present during discharge teaching. No further needs by Care Management. Prescriptions given to patient.   

## 2016-04-04 NOTE — Telephone Encounter (Signed)
TCM.the patient is being discharged and is coming 04/19/16 to see Jenetta Logesyan  Saw Dr Kirke CorinArida in hospital

## 2016-04-04 NOTE — Discharge Summary (Signed)
[facility] [place] at [facility]   PATIENT NAME: Jesse Soto    MR#:  [id number]  DATE OF BIRTH:  Dec 22, 1955  DATE OF ADMISSION:  04/01/2016 ADMITTING PHYSICIAN: Katharina Caper, MD  DATE OF DISCHARGE: 04/04/2016  PRIMARY CARE PHYSICIAN: No primary care provider on file.    ADMISSION DIAGNOSIS:  Chest pain [R07.9]  DISCHARGE DIAGNOSIS:  Active Problems:   Acute MI, subendocardial, initial episode of care (HCC)   HTN (hypertension)   Subendocardial MI first episode care [facility])   Chest pain   SECONDARY DIAGNOSIS:   Past Medical History  Diagnosis Date  . HLD (hyperlipidemia)   . Tobacco abuse   . Low back pain   . Psoriasis   . Abdominal hernia     HOSPITAL COURSE:   #1 Acute MI, subendocardial,  Cardiac cath done- RCA DES placed.  Still had epigastric pain- move to stepdown unit. Monitor.  Checked Lipid panel. LDL high- added atorvastatin.  Appreciated cardio help.  Due to persistent and higher troponin- cardio suggested to keep in hospital one more night.   He remained chest pain free, and was comfortable on ambulation.   Cardio suggested for out pt follow ups. #2. Elevated blood pressure without known history of Hypertension,  on metoprolol, stable. #3. Leukocytosis, unclear etiology #4. Hypokalemia, supplement orally, normal magnesium level   Potassium normal now. #5. Hyperglycemia, normal hemoglobin A1c #6. Epigastric abdominal pain, questionable gastritis, initiate patient on Protonix #7 tobacco abuse counseling. Discussed this patient for approximately 5 minutes, nicotine replacement therapy   DISCHARGE CONDITIONS:   Stable.  CONSULTS OBTAINED:  Treatment Team:  Iran Ouch, MD  DRUG ALLERGIES:   Allergies  Allergen Reactions  . Fluticasone Swelling    DISCHARGE MEDICATIONS:   Current Discharge Medication List    START taking these medications   Details  aspirin 81 MG chewable tablet Chew 1 tablet  (81 mg total) by mouth daily. Qty: 30 tablet, Refills: 0    atorvastatin (LIPITOR) 80 MG tablet Take 1 tablet (80 mg total) by mouth daily at 6 PM. Qty: 30 tablet, Refills: 0    metoprolol tartrate (LOPRESSOR) 25 MG tablet Take 1 tablet (25 mg total) by mouth 2 (two) times daily. Qty: 60 tablet, Refills: 0    pantoprazole (PROTONIX) 40 MG tablet Take 1 tablet (40 mg total) by mouth daily. Qty: 30 tablet, Refills: 0    ticagrelor (BRILINTA) 90 MG TABS tablet Take 1 tablet (90 mg total) by mouth 2 (two) times daily. Qty: 60 tablet, Refills: 0      CONTINUE these medications which have NOT CHANGED   Details  cyclobenzaprine (FLEXERIL) 5 MG tablet Take 5 mg by mouth daily as needed for muscle spasms.     traMADol (ULTRAM) 50 MG tablet Take 50 mg by mouth daily.          DISCHARGE INSTRUCTIONS:    Follow with Cardiologist in office in 1 weeks.  If you experience worsening of your admission symptoms, develop shortness of breath, life threatening emergency, suicidal or homicidal thoughts you must seek medical attention immediately by calling 911 or calling your MD immediately  if symptoms less severe.  You Must read complete instructions/literature along with all the possible adverse reactions/side effects for all the Medicines you take and that have been prescribed to you. Take any new Medicines after you have completely understood and accept all the possible adverse reactions/side effects.   Please note  You were cared for by  a hospitalist during your hospital stay. If you have any questions about your discharge medications or the care you received while you were in the hospital after you are discharged, you can call the unit and asked to speak with the hospitalist on call if the hospitalist that took care of you is not available. Once you are discharged, your primary care physician will handle any further medical issues. Please note that NO REFILLS for any discharge medications will  be authorized once you are discharged, as it is imperative that you return to your primary care physician (or establish a relationship with a primary care physician if you do not have one) for your aftercare needs so that they can reassess your need for medications and monitor your lab values.    Today   CHIEF COMPLAINT:   Chief Complaint  Patient presents with  . Chest Pain    HISTORY OF PRESENT ILLNESS:  Jesse Soto  is a 61 y.o. male with a known history of Ongoing tobacco abuse, chronic abdominal pain which she attributes to hiatal hernia who comes to the hospital with complaints of right-sided lower chest area pain, which started at around 1:02 PM earlier today after some exertion. Pain was described as 10 out of 10 by intensity, achy pain lasting for approximately 2 hours, radiating to right jaw. Patient felt weak, short of breath, lightheaded, as well as dizzy and presyncopal. On arrival to the emergency room he was noted to have elevated troponin, which got worse on repeat his lab in 4 hours. EKG revealed ST elevation in V2, V3. Hospitalist services were contacted for admission   VITAL SIGNS:  Blood pressure 116/62, pulse 66, temperature 97.4 F (36.3 C), temperature source Oral, resp. rate 20, height  (1.753 m), weight 83.6 kg (184 lb 4.9 oz), SpO2 98 %.  I/O:   Intake/Output Summary (Last 24 hours) at 04/04/16 1154 Last data filed at 04/04/16 0830  Gross per 24 hour  Intake    720 ml  Output    650 ml  Net     70 ml    PHYSICAL EXAMINATION:  GENERAL:  61 y.o.-year-old patient lying in the bed with no acute distress.  EYES: Pupils equal, round, reactive to light and accommodation. No scleral icterus. Extraocular muscles intact.  HEENT: Head atraumatic, normocephalic. Oropharynx and nasopharynx clear.  NECK:  Supple, no jugular venous distention. No thyroid enlargement, no tenderness.  LUNGS: Normal breath sounds bilaterally, no wheezing, rales,rhonchi or  crepitation. No use of accessory muscles of respiration.  CARDIOVASCULAR: S1, S2 normal. No murmurs, rubs, or gallops.  ABDOMEN: Soft, non-tender, non-distended. Bowel sounds present. No organomegaly or mass.  EXTREMITIES: No pedal edema, cyanosis, or clubbing.  NEUROLOGIC: Cranial nerves II through XII are intact. Muscle strength 5/5 in all extremities. Sensation intact. Gait not checked.  PSYCHIATRIC: The patient is alert and oriented x 3.  SKIN: No obvious rash, lesion, or ulcer.   DATA REVIEW:   CBC  Recent Labs Lab 04/03/16 0421  WBC 9.8  HGB 14.9  HCT 42.8  PLT 186    Chemistries   Recent Labs Lab 04/01/16 1649  04/03/16 0421  NA 136  < > 136  K 3.1*  < > 3.5  CL 104  < > 112*  CO2 23  < > 19*  GLUCOSE 127*  < > 114*  BUN 13  < > 11  CREATININE 1.12  < > 0.81  CALCIUM 9.4  < > 8.5*  MG  2.0  --   --   AST 28  < > 79*  ALT 14*  < > 16*  ALKPHOS 82  < > 60  BILITOT 0.7  < > 1.0  < > = values in this interval not displayed.  Cardiac Enzymes  Recent Labs Lab 04/03/16 0421  TROPONINI 16.91*    Microbiology Results  Results for orders placed or performed during the hospital encounter of 04/01/16  MRSA PCR Screening     Status: None   Collection Time: 04/02/16  6:21 PM  Result Value Ref Range Status   MRSA by PCR NEGATIVE NEGATIVE Final    Comment:        The GeneXpert MRSA Assay (FDA approved for NASAL specimens only), is one component of a comprehensive MRSA colonization surveillance program. It is not intended to diagnose MRSA infection nor to guide or monitor treatment for MRSA infections.     RADIOLOGY:  No results found.  EKG:   Orders placed or performed during the hospital encounter of 04/01/16  . EKG 12-Lead  . EKG 12-Lead  . EKG 12-Lead  . EKG 12-Lead  . EKG 12-Lead  . EKG 12-Lead  . EKG 12-Lead  . EKG 12-Lead  . EKG 12-Lead immediately post procedure  . EKG 12-Lead  . EKG 12-Lead  . EKG 12-Lead  . EKG 12-Lead immediately  post procedure  . EKG 12-Lead  . EKG 12-Lead  . EKG 12-Lead  . EKG 12-Lead  . EKG 12-Lead      Management plans discussed with the patient, family and they are in agreement.  CODE STATUS:     Code Status Orders        Start     Ordered   04/02/16 0101  Full code   Continuous     04/02/16 0100    Code Status History    Date Active Date Inactive Code Status Order ID Comments User Context   This patient has a current code status but no historical code status.      TOTAL TIME TAKING CARE OF THIS PATIENT: 35 minutes.    Altamese DillingVACHHANI, [place] Gipe M.D on 04/04/2016 at 11:54 AM  Between 7am to 6pm - Pager - [phone]  After 6pm go to [email or web address] - Social research officer, governmentpassword EPAS [email or web address]  [facility]  Office  [phone]  CC: Primary care physician; No primary care provider on file.   Note: This dictation was prepared with Dragon dictation along with smaller phrase technology. Any transcriptional errors that result from this process are unintentional.

## 2016-04-04 NOTE — Telephone Encounter (Signed)
Patient contacted regarding discharge [facility] on 04/04/16.  Patient understands to follow up with provider Eula Listenyan Dunn on April [facility] 1[facility]30pm[facility] . Patient understands discharge instructions? yes Patient understands medications and regiment? yes Patient understands to bring all medications to this visit? yes

## 2016-04-10 ENCOUNTER — Telehealth: Payer: Self-pay | Admitting: Cardiovascular Disease

## 2016-04-10 NOTE — Telephone Encounter (Signed)
Pt calling stating someone is sitting on his chest.  Not sure if it is just sore or not. Pt is coming 04/12/16 to see Dr Kirke CorinArida Please advise. Denies having SOB Just feels a bit odd, like how he did right before he went into hospital.

## 2016-04-10 NOTE — Telephone Encounter (Signed)
S/w pt who was recently d[facility] w/MI. Cardiac cath, stent placed. He was d/c'd 04/04/16 and reports feeling well until last night. He could not sleep, sore on entire left side of body from shoulder to leg, hoarse, and tired. Last evening he felt like "someone was sitting on my chest". He propped up on several pillows. Symptoms resolved today.  Denies SOB, CP, radiating left arm pain, jaw pain, nausea, diaphoresis last night or at this time.   Reports he has been reading lipitor brochure regarding side affects and feels he is having "all of them". He takes lipitor 80mg  qd which he started during recent hospital admission. Pt has never been seen in our office however, he has an upcoming appointment April 13 with Dr. Kirke CorinArida.  Advised pt to monitor s/s and if unsure, proceed to ER for further evaluation.  Pt verbalized understanding and is agreeable w/plan.

## 2016-04-12 ENCOUNTER — Telehealth: Payer: Self-pay | Admitting: Cardiovascular Disease

## 2016-04-12 ENCOUNTER — Other Ambulatory Visit: Payer: Self-pay

## 2016-04-12 ENCOUNTER — Ambulatory Visit (INDEPENDENT_AMBULATORY_CARE_PROVIDER_SITE_OTHER): Payer: Managed Care, Other (non HMO) | Admitting: Cardiovascular Disease

## 2016-04-12 ENCOUNTER — Encounter: Payer: Self-pay | Admitting: Cardiovascular Disease

## 2016-04-12 VITALS — BP 110/70 | HR 61 | Ht 69.0 in | Wt 184.4 lb

## 2016-04-12 DIAGNOSIS — Z7189 Other specified counseling: Secondary | ICD-10-CM

## 2016-04-12 DIAGNOSIS — I2109 ST elevation (STEMI) myocardial infarction involving other coronary artery of anterior wall: Secondary | ICD-10-CM

## 2016-04-12 DIAGNOSIS — E785 Hyperlipidemia, unspecified: Secondary | ICD-10-CM | POA: Diagnosis not present

## 2016-04-12 DIAGNOSIS — Z7689 Persons encountering health services in other specified circumstances: Secondary | ICD-10-CM

## 2016-04-12 MED ORDER — METOPROLOL TARTRATE 25 MG PO TABS: 25.0000 mg | ORAL_TABLET | Freq: Two times a day (BID) | ORAL | Status: DC

## 2016-04-12 MED ORDER — PANTOPRAZOLE SODIUM 40 MG PO TBEC: 40.0000 mg | DELAYED_RELEASE_TABLET | Freq: Every day | ORAL | Status: DC

## 2016-04-12 MED ORDER — ATORVASTATIN CALCIUM 40 MG PO TABS: 40.0000 mg | ORAL_TABLET | Freq: Every day | ORAL | Status: DC

## 2016-04-12 MED ORDER — NITROGLYCERIN 0.4 MG SL SUBL: 0.4000 mg | SUBLINGUAL_TABLET | SUBLINGUAL | Status: AC | PRN

## 2016-04-12 MED ORDER — TICAGRELOR 90 MG PO TABS
90.0000 mg | ORAL_TABLET | Freq: Two times a day (BID) | ORAL | Status: DC
Start: 2016-04-12 — End: 2016-06-07

## 2016-04-12 NOTE — Telephone Encounter (Signed)
Faxed [employer] information to Philis NettleLisa Williams, [employer] [phone]

## 2016-04-12 NOTE — Patient Instructions (Addendum)
Medication Instructions:  Your physician has recommended you make the following change in your medication:  DECREASE lipitor to 40mg  once daily You may take nitrostat 0.4mg  as needed for chest pain. Place one tablet under your tongue. Wait 5 minutes. If pain unresolved, you may take another. If pain persists after 3 tablets, please call 911 or go to nearest emergency room for evaluation.    Labwork: Fasting liver and lipid in 4 weeks. Nothing to eat or drink after midnight the evening before your labs.   Testing/Procedures: none  Follow-Up: Your physician recommends that you schedule a follow-up appointment in: 2 months with Dr. Kirke CorinArida.    Any Other Special Instructions Will Be Listed Below (If Applicable). Cardiac Rehab will call you to set up appointments.     If you need a refill on your cardiac medications before your next appointment, please call your pharmacy.

## 2016-04-12 NOTE — Progress Notes (Signed)
Cardiology Office Note   Date:  04/12/2016   ID:  Jesse PollRicky Donaho, DOB 1955-08-16, MRN [id number]  PCP:  No PCP Per Patient  Cardiologist:   Lorine BearsMuhammad Arida, MD   Chief Complaint  Patient presents with  . Follow-up    soreness & weakness      History of Present Illness: Jesse Soto is a 61 y.o. male who presents for  a follow-up visit after recent [facility] for non-ST elevation myocardial infarction. He has known history of hyperlipidemia, psoriasis and tobacco use. He presented with substernal chest heaviness and was found to have mildly elevated troponin. Cardiac catheterization showed 95% proximal RCA stenosis with very large thrombus, moderate mid RCA stenosis, occluded distal left circumflex supplying OM 3 territory with collaterals and moderate mid LAD stenosis. EF was 45%. Successful aspiration thrombectomy and drug-eluting stent placement to the proximal right coronary artery. This was complicated by distal embolization into PL 1 and the patient was placed on Aggrastat infusion overnight. He had chest pain post procedure which resolved by next morning. Echocardiogram after stent placement showed normal LV systolic function with no significant valvular abnormalities. He had 2 episodes of chest pain at night since hospital discharge but overall has been gradually improving. He does complain of left-sided aching and myalgias especially in the hip area. He also is having episodes of diarrhea. He has not smoked since his cardiac event. 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    Past Medical History  Diagnosis Date  . HLD (hyperlipidemia)   . Tobacco abuse   . Low back pain   . Psoriasis   . Abdominal hernia   . Coronary artery disease 03/2016     non-ST elevation myocardial infarction. Cardiac catheterization showed 95% proximal RCA stenosis with very large thrombus, moderate mid RCA stenosis, occluded distal left circumflex supplying OM 3 territory with  collaterals and moderate mid LAD stenosis. EF was 45%. Successful aspiration thrombectomy and drug-eluting stent placement to the proximal right coronary artery     Past Surgical History  Procedure Laterality Date  . Hernia repair    . Cholecystectomy    . Knee surgery    . Peripheral vascular catheterization  04/02/2016    Procedure: Coronary Angiogram ;  Surgeon: Iran OuchMuhammad A Arida, MD;  Location: [facility] INVASIVE CV LAB;  Service: Cardiovascular;;  . Cardiac catheterization N/A 04/02/2016    Procedure: Left Heart Cath;  Surgeon: Iran OuchMuhammad A Arida, MD;  Location: [facility] INVASIVE CV LAB;  Service: Cardiovascular;  Laterality: N/A;  . Cardiac catheterization N/A 04/02/2016    Procedure: Coronary Stent Intervention;  Surgeon: Iran OuchMuhammad A Arida, MD;  Location: [facility] INVASIVE CV LAB;  Service: Cardiovascular;  Laterality: N/A;     Current Outpatient Prescriptions  Medication Sig Dispense Refill  . aspirin 81 MG chewable tablet Chew 1 tablet (81 mg total) by mouth daily. 30 tablet 0  . atorvastatin (LIPITOR) 40 MG tablet Take 1 tablet (40 mg total) by mouth daily at 6 PM. 30 tablet 3  . cyclobenzaprine (FLEXERIL) 5 MG tablet Take 5 mg by mouth daily as needed for muscle spasms.     . metoprolol tartrate (LOPRESSOR) 25 MG tablet Take 1 tablet (25 mg total) by mouth 2 (two) times daily. 60 tablet 3  . pantoprazole (PROTONIX) 40 MG tablet Take 1 tablet (40 mg total) by mouth daily. 30 tablet 3  . ticagrelor (BRILINTA) 90 MG TABS tablet Take 1 tablet (90 mg total) by mouth 2 (two) times  daily. 60 tablet 3  . traMADol (ULTRAM) 50 MG tablet Take 50 mg by mouth daily.     . nitroGLYCERIN (NITROSTAT) 0.4 MG SL tablet Place 1 tablet (0.4 mg total) under the tongue every 5 (five) minutes as needed for chest pain. 25 tablet 3   No current facility-administered medications for this visit.    Allergies:   Fluticasone    Social History:  The patient  reports that he has been smoking Cigarettes.  He has a 30  pack-year smoking history. He has never used smokeless tobacco. He reports that he drinks alcohol. He reports that he does not use illicit drugs.   Family History:  The patient's Family history is remarkable for hypertension   ROS:  Please see the history of present illness.   Otherwise, review of systems are positive for none.   All other systems are reviewed and negative.    PHYSICAL EXAM: VS:  BP 110/70 mmHg  Pulse 61  Ht  (1.753 m)  Wt 184 lb 6.4 oz (83.643 kg)  BMI 27.22 kg/m2  SpO2 95% , BMI Body mass index is 27.22 kg/(m^2). GEN: Well nourished, well developed, in no acute distress HEENT: normal Neck: no JVD, carotid bruits, or masses Cardiac: RRR; no murmurs, rubs, or gallops,no edema  Respiratory:  clear to auscultation bilaterally, normal work of breathing GI: soft, nontender, nondistended, + BS MS: no deformity or atrophy Skin: warm and dry, no rash Neuro:  Strength and sensation are intact Psych: euthymic mood, full affect Right radial pulse is normal with no hematoma  EKG:  EKG is ordered today. The ekg ordered today demonstrates normal sinus rhythm with old inferior infarct.   Recent Labs: 04/01/2016: Magnesium 2.0 04/03/2016: ALT 16*; BUN 11; Creatinine, Ser 0.81; Hemoglobin 14.9; Platelets 186; Potassium 3.5; Sodium 136    Lipid Panel    Component Value Date/Time   CHOL 199 04/01/2016 1649   TRIG 76 04/01/2016 1649   HDL 31* 04/01/2016 1649   CHOLHDL 6.4 04/01/2016 1649   VLDL 15 04/01/2016 1649   LDLCALC 153* 04/01/2016 1649      Wt Readings from Last 3 Encounters:  04/12/16 184 lb 6.4 oz (83.643 kg)  04/02/16 184 lb 4.9 oz (83.6 kg)  09/28/11 170 lb (77.111 kg)        ASSESSMENT AND PLAN:  1.  Non-ST elevation myocardial infarction: Status post recent angioplasty and drug-eluting stent placement to the right coronary artery. Continue aggressive medical therapy for his residual coronary artery disease. Continue dual antiplatelet therapy for  at least one year. I referred him to cardiac rehabilitation. He can resume work on May 5 without restriction as long as he continues to progress.  2. Hyperlipidemia: He seems to be having left sided myalgia and thus I decided to decrease the dose of atorvastatin to 40 mg daily. Check fasting lipid and liver profile in one month.    3. Tobacco use: The patient's quit smoking since his cardiac event.    Disposition:   FU with me in 2 months  Signed,  Lorine Bears, MD  04/12/2016 4:20 PM    [facility]

## 2016-04-12 NOTE — Telephone Encounter (Signed)
Pt dropped of paper work from [employer] [place] [employer]  Sent to [facility] on 04/12/16 Pt was aware there would be a fee of $25. They said they will await to hear from us about this and then pay that.

## 2016-04-19 ENCOUNTER — Encounter: Payer: Managed Care, Other (non HMO) | Admitting: Physician Assistant

## 2016-05-10 ENCOUNTER — Other Ambulatory Visit: Payer: Self-pay

## 2016-05-10 ENCOUNTER — Encounter: Payer: Self-pay | Admitting: Cardiovascular Disease

## 2016-05-10 ENCOUNTER — Other Ambulatory Visit (INDEPENDENT_AMBULATORY_CARE_PROVIDER_SITE_OTHER): Payer: Managed Care, Other (non HMO)

## 2016-05-10 ENCOUNTER — Telehealth: Payer: Self-pay | Admitting: Cardiovascular Disease

## 2016-05-10 DIAGNOSIS — E785 Hyperlipidemia, unspecified: Secondary | ICD-10-CM

## 2016-05-10 DIAGNOSIS — R0789 Other chest pain: Secondary | ICD-10-CM

## 2016-05-10 NOTE — Telephone Encounter (Signed)
Schedule him for a treadmill Myoview.

## 2016-05-10 NOTE — Telephone Encounter (Signed)
S/w pt of Dr. Jari SportsmanArida's recommendations. Pt is agreeable to lexi myoview Monday, May 15, 7:15am arrival. He is at work at this time and asks I call back and leave all information on his cell phone. Briefly reviewed instructions w/pt then called back w/ detailed message on cell phone. Pt verbalized understanding with no further questions.

## 2016-05-10 NOTE — Telephone Encounter (Signed)
Pt in the office today for labs. Stated to Bishop DublinSharon Crespo, CMA, of recent chest pain/pressure. I s/w pt who states he experienced chest pain/pressure Monday and Tuesday of this week while at work which lasted approximately one hour. Rated pain 5/10 with some left arm pain. Denies any other symptoms. States he ate lunch from the food truck at work. He had gyro and thinks the pain could have been GI related from the cucumber dressing.  He takes protonix for epigastric pain.  When he experienced the chest pain on Tuesday, he took sublingual nitro x 3 which he states relieved the pain. He laid down in the conference room at work and rested. The work nurse took [employer] Wednesday. Reports BP 120/80, HR in the 50s. He has not experienced symptoms at home. They typically occur while he is on the job. Pt denies symptoms today. Admits to being stressed at work as he has just returned from April hospitalization for MI with drug-eluding stent to the RCA.  He saw Eula ListenRyan Dunn 4/13 and cleared to return May 5.  Reviewed s/s that would require pt to be seen immediately in an ED setting. He verbalized understanding and is agreeable w/plan. Will forward to MD to make aware and for further recommendations.

## 2016-05-10 NOTE — Patient Instructions (Signed)
Your physician has requested that you have a lexiscan myoview. For further information please visit [email or web address]. Please follow instruction sheet, as given.  [facility] MYOVIEW  Your caregiver has ordered a Stress Test with nuclear imaging. The purpose of this test is to evaluate the blood supply to your heart muscle. This procedure is referred to as a "Non-Invasive Stress Test." This is because other than having an IV started in your vein, nothing is inserted or "invades" your body. Cardiac stress tests are done to find areas of poor blood flow to the heart by determining the extent of coronary artery disease (CAD). Some patients exercise on a treadmill, which naturally increases the blood flow to your heart, while others who are  unable to walk on a treadmill due to physical limitations have a pharmacologic/chemical stress agent called Lexiscan . This medicine will mimic walking on a treadmill by temporarily increasing your coronary blood flow.   Please note: these test may take anywhere between 2-4 hours to complete  PLEASE REPORT TO [facility] MEDICAL MALL ENTRANCE  THE VOLUNTEERS AT THE FIRST DESK WILL DIRECT YOU WHERE TO GO  Date of Procedure: Monday, May 15  Arrival Time for Procedure: 7:15am Instructions regarding medication:     _xx___:  Hold metoprolol the night before procedure and morning of procedure   PLEASE NOTIFY THE OFFICE AT LEAST 24 HOURS IN ADVANCE IF YOU ARE UNABLE TO KEEP YOUR APPOINTMENT.  [phone] AND  PLEASE NOTIFY NUCLEAR MEDICINE AT [facility] AT LEAST 24 HOURS IN ADVANCE IF YOU ARE UNABLE TO KEEP YOUR APPOINTMENT. [phone]  How to prepare for your Myoview test:   Do not eat or drink after midnight  No caffeine for 24 hours prior to test  No smoking 24 hours prior to test.  Your medication may be taken with water.  If your doctor stopped a medication because of this test, do not take that medication.  Ladies, please do not wear dresses.  Skirts or pants are  appropriate. Please wear a short sleeve shirt.  No perfume, cologne or lotion.  Wear comfortable walking shoes. No heels!          Cardiac Nuclear Scanning A cardiac nuclear scan is used to check your heart for problems, such as the following:  A portion of the heart is not getting enough blood.  Part of the heart muscle has died, which happens with a heart attack.  The heart wall is not working normally.  In this test, a radioactive dye (tracer) is injected into your bloodstream. After the tracer has traveled to your heart, a scanning device is used to measure how much of the tracer is absorbed by or distributed to various areas of your heart. LET [facility] PROVIDER KNOW ABOUT:  Any allergies you have.  All medicines you are taking, including vitamins, herbs, eye drops, creams, and over-the-counter medicines.  Previous problems you or members of your family have had with the use of anesthetics.  Any blood disorders you have.  Previous surgeries you have had.  Medical conditions you have.  RISKS AND COMPLICATIONS Generally, this is a safe procedure. However, as with any procedure, problems can occur. Possible problems include:   Serious chest pain.  Rapid heartbeat.  Sensation of warmth in your chest. This usually passes quickly. BEFORE THE PROCEDURE Ask your health care provider about changing or stopping your regular medicines. PROCEDURE This procedure is usually done at a hospital and takes 2-4 hours.  An IV tube is inserted into  one of your veins.  Your health care provider will inject a small amount of radioactive tracer through the tube.  You will then wait for 20-40 minutes while the tracer travels through your bloodstream.  You will lie down on an exam table so images of your heart can be taken. Images will be taken for about 15-20 minutes.  You will exercise on a treadmill or stationary bike. While you exercise, your heart activity will be  monitored with an electrocardiogram (ECG), and your blood pressure will be checked.  If you are unable to exercise, you may be given a medicine to make your heart beat faster.  When blood flow to your heart has peaked, tracer will again be injected through the IV tube.  After 20-40 minutes, you will get back on the exam table and have more images taken of your heart.  When the procedure is over, your IV tube will be removed. AFTER THE PROCEDURE  You will likely be able to leave shortly after the test. Unless your health care provider tells you otherwise, you may return to your normal schedule, including diet, activities, and medicines.  Make sure you find out how and when you will get your test results.   This information is not intended to replace advice given to you by your health care provider. Make sure you discuss any questions you have with your health care provider.   Document Released: 01/11/2005 Document Revised: 12/22/2013 Document Reviewed: 11/25/2013 Elsevier Interactive Patient Education [employer]

## 2016-05-11 LAB — HEPATIC FUNCTION PANEL
ALK PHOS: 89 IU/L (ref 39–117)
ALT: 13 IU/L (ref 0–44)
AST: 26 IU/L (ref 0–40)
Albumin: 4.1 g/dL (ref 3.5–5.5)
BILIRUBIN TOTAL: 0.7 mg/dL (ref 0.0–1.2)
BILIRUBIN, DIRECT: 0.17 mg/dL (ref 0.00–0.40)
TOTAL PROTEIN: 6.9 g/dL (ref 6.0–8.5)

## 2016-05-11 LAB — LIPID PANEL
CHOLESTEROL TOTAL: 132 mg/dL (ref 100–199)
Chol/HDL Ratio: 4 ratio units (ref 0.0–5.0)
HDL: 33 mg/dL — ABNORMAL LOW (ref >39)
LDL CALC: 77 mg/dL (ref 0–99)
Triglycerides: 108 mg/dL (ref 0–149)
VLDL Cholesterol Cal: 22 mg/dL (ref 5–40)

## 2016-05-14 ENCOUNTER — Encounter
Admission: RE | Admit: 2016-05-14 | Discharge: 2016-05-14 | Disposition: A | Discharge status: Home / Self Care | Payer: Managed Care, Other (non HMO) | Source: Ambulatory Visit | Attending: Cardiovascular Disease | Admitting: Cardiovascular Disease

## 2016-05-14 DIAGNOSIS — I510 Cardiac septal defect, acquired: Secondary | ICD-10-CM | POA: Diagnosis not present

## 2016-05-14 DIAGNOSIS — R0789 Other chest pain: Secondary | ICD-10-CM

## 2016-05-14 LAB — NM MYOCAR MULTI W/SPECT W/WALL MOTION / EF
CHL CUP MPHR: 161 {beats}/min
CHL CUP NUCLEAR SDS: 5
CSEPEW: 1 METS
CSEPHR: 62 %
CSEPPHR: 101 {beats}/min
Exercise duration (min): 1 min
Exercise duration (sec): 59 s
LVDIAVOL: 87 mL (ref 62–150)
LVSYSVOL: 58 mL
NUC STRESS TID: 1.06
Rest HR: 52 {beats}/min
SRS: 0
SSS: 5

## 2016-05-14 MED ORDER — TECHNETIUM TC 99M TETROFOSMIN IV KIT
31.5200 | PACK | Freq: Once | INTRAVENOUS | Status: AC | PRN
  Administered 2016-05-14: 31.52 via INTRAVENOUS

## 2016-05-14 MED ORDER — TECHNETIUM TC 99M TETROFOSMIN IV KIT
13.0000 | PACK | Freq: Once | INTRAVENOUS | Status: AC | PRN
  Administered 2016-05-14: 14.11 via INTRAVENOUS

## 2016-05-14 MED ORDER — REGADENOSON 0.4 MG/5ML IV SOLN
0.4000 mg | Freq: Once | INTRAVENOUS | Status: AC
  Administered 2016-05-14: 0.4 mg via INTRAVENOUS

## 2016-05-16 ENCOUNTER — Other Ambulatory Visit: Payer: Self-pay | Admitting: Cardiovascular Disease

## 2016-05-16 ENCOUNTER — Telehealth: Payer: Self-pay | Admitting: Cardiovascular Disease

## 2016-05-16 ENCOUNTER — Other Ambulatory Visit
Admission: RE | Admit: 2016-05-16 | Discharge: 2016-05-16 | Disposition: A | Discharge status: Home / Self Care | Payer: Managed Care, Other (non HMO) | Source: Ambulatory Visit | Attending: Cardiovascular Disease | Admitting: Cardiovascular Disease

## 2016-05-16 ENCOUNTER — Other Ambulatory Visit: Payer: Self-pay

## 2016-05-16 DIAGNOSIS — Z5181 Encounter for therapeutic drug level monitoring: Secondary | ICD-10-CM

## 2016-05-16 DIAGNOSIS — R931 Abnormal findings on diagnostic imaging of heart and coronary circulation: Secondary | ICD-10-CM | POA: Insufficient documentation

## 2016-05-16 DIAGNOSIS — Z01812 Encounter for preprocedural laboratory examination: Secondary | ICD-10-CM

## 2016-05-16 DIAGNOSIS — I208 Other forms of angina pectoris: Secondary | ICD-10-CM

## 2016-05-16 DIAGNOSIS — R9439 Abnormal result of other cardiovascular function study: Secondary | ICD-10-CM

## 2016-05-16 LAB — BASIC METABOLIC PANEL
Anion gap: 5 (ref 5–15)
BUN: 14 mg/dL (ref 6–20)
CALCIUM: 9 mg/dL (ref 8.9–10.3)
CO2: 20 mmol/L — AB (ref 22–32)
CREATININE: 1.04 mg/dL (ref 0.61–1.24)
Chloride: 112 mmol/L — ABNORMAL HIGH (ref 101–111)
GFR calc Af Amer: >60 mL/min (ref >60)
GLUCOSE: 200 mg/dL — AB (ref 65–99)
Potassium: 3.7 mmol/L (ref 3.5–5.1)
Sodium: 137 mmol/L (ref 135–145)

## 2016-05-16 LAB — CBC WITH DIFFERENTIAL/PLATELET
BASOS ABS: 0.1 10*3/uL (ref 0–0.1)
BASOS PCT: 1 %
EOS ABS: 0.4 10*3/uL (ref 0–0.7)
EOS PCT: 5 %
HCT: 42.7 % (ref 40.0–52.0)
Hemoglobin: 15 g/dL (ref 13.0–18.0)
LYMPHS PCT: 26 %
Lymphs Abs: 2.1 10*3/uL (ref 1.0–3.6)
MCH: 29.9 pg (ref 26.0–34.0)
MCHC: 35.1 g/dL (ref 32.0–36.0)
MCV: 85.2 fL (ref 80.0–100.0)
MONO ABS: 0.7 10*3/uL (ref 0.2–1.0)
Monocytes Relative: 9 %
Neutro Abs: 4.8 10*3/uL (ref 1.4–6.5)
Neutrophils Relative %: 59 %
PLATELETS: 209 10*3/uL (ref 150–440)
RBC: 5.02 MIL/uL (ref 4.40–5.90)
RDW: 13.6 % (ref 11.5–14.5)
WBC: 8 10*3/uL (ref 3.8–10.6)

## 2016-05-16 LAB — PROTIME-INR
INR: 0.95
PROTHROMBIN TIME: 12.9 s (ref 11.4–15.0)

## 2016-05-16 LAB — DIGOXIN LEVEL

## 2016-05-16 NOTE — Telephone Encounter (Signed)
Patient c/o color perception distortion and pulse is 64 and reg [facility]  Please call patient.  Nurse thinks he may need to have digoxin checked.  Patient is not feeling well and may need to be seen.

## 2016-05-16 NOTE — Telephone Encounter (Signed)
No need to add Digoxin level. He is not on Digoxin so I am not sure why the health nurse thought of that.

## 2016-05-16 NOTE — Telephone Encounter (Signed)
Pt sched for cardiac cath tomorrow @ 2:30. Will make Dr. Kirke CorinArida aware of his concerns.

## 2016-05-16 NOTE — Telephone Encounter (Signed)
He's coming in this afternoon for labs.   I'll add a digoxin level.

## 2016-05-16 NOTE — Telephone Encounter (Signed)
He is not on Digoxin ?!! He needs precath labs to make sure things are stable.

## 2016-05-17 ENCOUNTER — Ambulatory Visit
Admission: RE | Admit: 2016-05-17 | Discharge: 2016-05-17 | Disposition: A | Discharge status: Home / Self Care | Payer: Managed Care, Other (non HMO) | Source: Ambulatory Visit | Attending: Cardiovascular Disease | Admitting: Cardiovascular Disease

## 2016-05-17 ENCOUNTER — Encounter: Payer: Self-pay | Admitting: *Deleted

## 2016-05-17 ENCOUNTER — Telehealth: Payer: Self-pay

## 2016-05-17 ENCOUNTER — Encounter
Admission: RE | Disposition: A | Discharge status: Home / Self Care | Payer: Self-pay | Source: Ambulatory Visit | Attending: Cardiovascular Disease

## 2016-05-17 DIAGNOSIS — Z9049 Acquired absence of other specified parts of digestive tract: Secondary | ICD-10-CM | POA: Insufficient documentation

## 2016-05-17 DIAGNOSIS — Z79899 Other long term (current) drug therapy: Secondary | ICD-10-CM | POA: Diagnosis not present

## 2016-05-17 DIAGNOSIS — I208 Other forms of angina pectoris: Secondary | ICD-10-CM | POA: Diagnosis not present

## 2016-05-17 DIAGNOSIS — Z7982 Long term (current) use of aspirin: Secondary | ICD-10-CM | POA: Diagnosis not present

## 2016-05-17 DIAGNOSIS — E785 Hyperlipidemia, unspecified: Secondary | ICD-10-CM | POA: Diagnosis not present

## 2016-05-17 DIAGNOSIS — I493 Ventricular premature depolarization: Secondary | ICD-10-CM | POA: Diagnosis not present

## 2016-05-17 DIAGNOSIS — Z955 Presence of coronary angioplasty implant and graft: Secondary | ICD-10-CM | POA: Insufficient documentation

## 2016-05-17 DIAGNOSIS — I252 Old myocardial infarction: Secondary | ICD-10-CM | POA: Diagnosis not present

## 2016-05-17 DIAGNOSIS — L409 Psoriasis, unspecified: Secondary | ICD-10-CM | POA: Insufficient documentation

## 2016-05-17 DIAGNOSIS — M545 Low back pain: Secondary | ICD-10-CM | POA: Insufficient documentation

## 2016-05-17 DIAGNOSIS — I25119 Atherosclerotic heart disease of native coronary artery with unspecified angina pectoris: Secondary | ICD-10-CM | POA: Insufficient documentation

## 2016-05-17 DIAGNOSIS — Z87891 Personal history of nicotine dependence: Secondary | ICD-10-CM | POA: Insufficient documentation

## 2016-05-17 DIAGNOSIS — R9439 Abnormal result of other cardiovascular function study: Secondary | ICD-10-CM | POA: Diagnosis present

## 2016-05-17 DIAGNOSIS — Z8249 Family history of ischemic heart disease and other diseases of the circulatory system: Secondary | ICD-10-CM | POA: Diagnosis not present

## 2016-05-17 HISTORY — PX: CARDIAC CATHETERIZATION: SHX172

## 2016-05-17 SURGERY — LEFT HEART CATH AND CORONARY ANGIOGRAPHY
Anesthesia: Moderate Sedation

## 2016-05-17 MED ORDER — ASPIRIN 81 MG PO CHEW: 81.0000 mg | CHEWABLE_TABLET | ORAL | Status: DC

## 2016-05-17 MED ORDER — NITROGLYCERIN 5 MG/ML IV SOLN
INTRAVENOUS | Status: AC
  Filled 2016-05-17: qty 10

## 2016-05-17 MED ORDER — SODIUM CHLORIDE 0.9 % IV SOLN: INTRAVENOUS | Status: DC

## 2016-05-17 MED ORDER — SODIUM CHLORIDE 0.9% FLUSH
3.0000 mL | Freq: Two times a day (BID) | INTRAVENOUS | Status: DC
  Administered 2016-05-17: 10 mL via INTRAVENOUS

## 2016-05-17 MED ORDER — IOPAMIDOL (ISOVUE-300) INJECTION 61%
INTRAVENOUS | Status: DC | PRN
  Administered 2016-05-17: 115 mL via INTRA_ARTERIAL

## 2016-05-17 MED ORDER — VERAPAMIL HCL 2.5 MG/ML IV SOLN
INTRAVENOUS | Status: AC
  Filled 2016-05-17: qty 2

## 2016-05-17 MED ORDER — MIDAZOLAM HCL 2 MG/2ML IJ SOLN
INTRAMUSCULAR | Status: AC
  Filled 2016-05-17: qty 2

## 2016-05-17 MED ORDER — HEPARIN (PORCINE) IN NACL 2-0.9 UNIT/ML-% IJ SOLN
INTRAMUSCULAR | Status: AC
  Filled 2016-05-17: qty 1000

## 2016-05-17 MED ORDER — NITROGLYCERIN 1 MG/10 ML FOR IR/CATH LAB
INTRA_ARTERIAL | Status: DC | PRN
  Administered 2016-05-17: 300 mL via INTRACORONARY

## 2016-05-17 MED ORDER — ADENOSINE (DIAGNOSTIC) 140MCG/KG/MIN
INTRAVENOUS | Status: DC | PRN
  Administered 2016-05-17: 140 ug/kg/min via INTRAVENOUS

## 2016-05-17 MED ORDER — HEPARIN SODIUM (PORCINE) 1000 UNIT/ML IJ SOLN
INTRAMUSCULAR | Status: AC
  Filled 2016-05-17: qty 1

## 2016-05-17 MED ORDER — FENTANYL CITRATE (PF) 100 MCG/2ML IJ SOLN
INTRAMUSCULAR | Status: AC
  Filled 2016-05-17: qty 2

## 2016-05-17 MED ORDER — SODIUM CHLORIDE 0.9 % IV SOLN: 250.0000 mL | INTRAVENOUS | Status: DC | PRN

## 2016-05-17 MED ORDER — SODIUM CHLORIDE 0.9 % IV SOLN
INTRAVENOUS | Status: DC
  Administered 2016-05-17: 15:00:00 via INTRAVENOUS

## 2016-05-17 MED ORDER — MIDAZOLAM HCL 2 MG/2ML IJ SOLN
INTRAMUSCULAR | Status: DC | PRN
  Administered 2016-05-17 (×3): 1 mg via INTRAVENOUS

## 2016-05-17 MED ORDER — SODIUM CHLORIDE 0.9% FLUSH: 3.0000 mL | Freq: Two times a day (BID) | INTRAVENOUS | Status: DC

## 2016-05-17 MED ORDER — FENTANYL CITRATE (PF) 100 MCG/2ML IJ SOLN
INTRAMUSCULAR | Status: DC | PRN
  Administered 2016-05-17 (×2): 50 ug via INTRAVENOUS
  Administered 2016-05-17: 25 ug via INTRAVENOUS

## 2016-05-17 MED ORDER — SODIUM CHLORIDE 0.9% FLUSH: 3.0000 mL | INTRAVENOUS | Status: DC | PRN

## 2016-05-17 MED ORDER — HEPARIN SODIUM (PORCINE) 1000 UNIT/ML IJ SOLN
INTRAMUSCULAR | Status: DC | PRN
  Administered 2016-05-17 (×2): 4000 [IU] via INTRAVENOUS

## 2016-05-17 MED ORDER — ADENOSINE (DIAGNOSTIC) 3 MG/ML IV SOLN
INTRAVENOUS | Status: AC
  Filled 2016-05-17: qty 30

## 2016-05-17 SURGICAL SUPPLY — 10 items
CATH 5F 110X4 TIG (CATHETERS) ×4 IMPLANT
CATH VISTA GUIDE 6FR JL3.5 (CATHETERS) ×4 IMPLANT
CATH VISTA GUIDE 6FR JR4 (CATHETERS) ×4 IMPLANT
DEVICE INFLAT 30 PLUS (MISCELLANEOUS) ×4 IMPLANT
DEVICE RAD TR BAND REGULAR (VASCULAR PRODUCTS) ×4 IMPLANT
GLIDESHEATH SLEND SS 6F .021 (SHEATH) ×4 IMPLANT
KIT MANI 3VAL PERCEP (MISCELLANEOUS) ×4 IMPLANT
PACK CARDIAC CATH (CUSTOM PROCEDURE TRAY) ×4 IMPLANT
WIRE PRESSURE VERRATA (WIRE) ×4 IMPLANT
WIRE SAFE-T 1.5MM-J .035X260CM (WIRE) ×4 IMPLANT

## 2016-05-17 NOTE — Telephone Encounter (Signed)
I've put the order in, so he's ready to be scheduled.

## 2016-05-17 NOTE — Discharge Instructions (Signed)
radial Insertion Instructions- Loss of feeling in your arm might mean that a blockage has formed in the artery and this can be appropriately treated.  Limit your activity for the next two days after your procedure. Keep affected arm immobilized for 24hrs post procedure, check the insertion site occasionally.  If any oozing occurs or there is apparent swelling, firm pressure over the site will prevent a bruise from forming.  You can not hurt anything by pressing directly on the site.  The pressure stops the bleeding by allowing a small clot to form.  If the bleeding continues after the pressure has been applied for more than 15 minutes, call 911 or go to the nearest emergency room.    The x-ray dye causes you to pass a considerate amount of urine.  For this reason, you will be asked to drink plenty of liquids after the procedure to prevent dehydration.  You may resume you regular diet.  Avoid caffeine products.    For pain at the site of your procedure, take non-aspirin medicines such as Tylenol.  Medications: A. Hold Metformin for 48 hours if applicable.  B. Continue taking all your present medications at home unless your doctor prescribes any changes.

## 2016-05-17 NOTE — Telephone Encounter (Signed)
-----   Message from Iran OuchMuhammad A Arida, MD sent at 05/17/2016  5:06 PM EDT ----- Needs a 24 hour Holter monitor for PVCs.

## 2016-05-17 NOTE — H&P (Signed)
History and Physical  Patient ID: Jesse Soto MRN: [id number] DOB/AGE: 08/04/1955 61 y.o. Admit date: 05/17/2016  Primary Care Physician: No PCP Per Patient Primary Cardiologist Dr. Kirke CorinArida.    HPI:  Jesse Soto is a 61 y.o. male who presents for  left heart catheterization due to persistent chest pain and abnormal stress test.  He has known history of hyperlipidemia, psoriasis and tobacco use. He presented in April with substernal chest heaviness and was found to have mildly elevated troponin. Cardiac catheterization showed 95% proximal RCA stenosis with very large thrombus, moderate mid RCA stenosis, occluded distal left circumflex supplying OM 3 territory with collaterals and moderate mid LAD stenosis. EF was 45%. Successful aspiration thrombectomy and drug-eluting stent placement to the proximal right coronary artery. This was complicated by distal embolization into PL 1 and the patient was placed on Aggrastat infusion overnight. He had chest pain post procedure which resolved by next morning. Echocardiogram after stent placement showed normal LV systolic function with no significant valvular abnormalities. He continued to have chest pain with activities after hospital discharge consistent with class II angina in spite of antianginal therapy. He underwent a nuclear stress test which showed evidence of anterior wall ischemia and overall was a moderate risk study.   A 10 point review of system was performed. It is negative other than that mentioned in the history of present illness.   Past Medical History  Diagnosis Date  . HLD (hyperlipidemia)   . Tobacco abuse   . Low back pain   . Psoriasis   . Abdominal hernia   . Coronary artery disease 03/2016     non-ST elevation myocardial infarction. Cardiac catheterization showed 95% proximal RCA stenosis with very large thrombus, moderate mid RCA stenosis, occluded distal left circumflex supplying OM 3 territory with collaterals and  moderate mid LAD stenosis. EF was 45%. Successful aspiration thrombectomy and drug-eluting stent placement to the proximal right coronary artery     Family History  Problem Relation Age of Onset  . Hypertension      Social History   Social History  . Marital Status: Married    Spouse Name: N/A  . Number of Children: N/A  . Years of Education: N/A   Occupational History  . Not on file.   Social History Main Topics  . Smoking status: Former Smoker -- 1.00 packs/day for 30 years    Types: Cigarettes    Quit date: 04/01/2016  . Smokeless tobacco: Never Used  . Alcohol Use: Yes     Comment: occasional use  . Drug Use: No  . Sexual Activity: No   Other Topics Concern  . Not on file   Social History Narrative    Past Surgical History  Procedure Laterality Date  . Hernia repair    . Cholecystectomy    . Knee surgery    . Peripheral vascular catheterization  04/02/2016    Procedure: Coronary Angiogram ;  Surgeon: Iran OuchMuhammad A Ryhanna Dunsmore, MD;  Location: [facility] INVASIVE CV LAB;  Service: Cardiovascular;;  . Cardiac catheterization N/A 04/02/2016    Procedure: Left Heart Cath;  Surgeon: Iran OuchMuhammad A Annet Manukyan, MD;  Location: [facility] INVASIVE CV LAB;  Service: Cardiovascular;  Laterality: N/A;  . Cardiac catheterization N/A 04/02/2016    Procedure: Coronary Stent Intervention;  Surgeon: Iran OuchMuhammad A Aunica Dauphinee, MD;  Location: [facility] INVASIVE CV LAB;  Service: Cardiovascular;  Laterality: N/A;     Prescriptions prior to admission  Medication Sig Dispense Refill Last Dose  . aspirin 81  MG chewable tablet Chew 1 tablet (81 mg total) by mouth daily. 30 tablet 0 05/17/2016 at Unknown time  . atorvastatin (LIPITOR) 40 MG tablet Take 1 tablet (40 mg total) by mouth daily at 6 PM. 30 tablet 3 05/16/2016 at Unknown time  . metoprolol tartrate (LOPRESSOR) 25 MG tablet Take 1 tablet (25 mg total) by mouth 2 (two) times daily. 60 tablet 3 05/17/2016 at Unknown time  . nitroGLYCERIN (NITROSTAT) 0.4 MG SL tablet Place 1 tablet  (0.4 mg total) under the tongue every 5 (five) minutes as needed for chest pain. 25 tablet 3 Past Week at Unknown time  . pantoprazole (PROTONIX) 40 MG tablet Take 1 tablet (40 mg total) by mouth daily. 30 tablet 3 05/17/2016 at Unknown time  . ticagrelor (BRILINTA) 90 MG TABS tablet Take 1 tablet (90 mg total) by mouth 2 (two) times daily. 60 tablet 3 05/17/2016 at Unknown time  . traMADol (ULTRAM) 50 MG tablet Take 50 mg by mouth daily.    05/17/2016 at Unknown time  . cyclobenzaprine (FLEXERIL) 5 MG tablet Take 5 mg by mouth daily as needed for muscle spasms. Reported on 05/17/2016   Completed Course at Unknown time    Physical Exam: Blood pressure 169/80, pulse 69, temperature 97.6 F (36.4 C), resp. rate 14, height 5\' 9"  (1.753 m), weight 183 lb (83.008 kg), SpO2 96 %.   Constitutional:  oriented to person, place, and time. He appears well-developed and well-nourished. No distress.  HENT: No nasal discharge.  Head: Normocephalic and atraumatic.  Eyes: Pupils are equal and round.  No discharge. Neck: Normal range of motion. Neck supple. No JVD present. No thyromegaly present.  Cardiovascular: Normal rate, regular rhythm, normal heart sounds. Exam reveals no gallop and no friction rub. No murmur heard.  Pulmonary/Chest: Effort normal and breath sounds normal. No stridor. No respiratory distress.  no wheezes or rales.   Abdominal: Soft. Bowel sounds are normal. He exhibits no distension. There is no tenderness. There is no rebound and no guarding.  Musculoskeletal: Normal range of motion. No edema and no tenderness.  Neurological: Alert and oriented to person, place, and time. Coordination normal.  Skin: Skin is warm and dry. No rash noted. He is not diaphoretic. No erythema. No pallor.  Psychiatric: Normal mood and affect.  behavior is normal. Judgment and thought content normal.    Labs:   Lab Results  Component Value Date   WBC 8.0 05/16/2016   HGB 15.0 05/16/2016   HCT 42.7 05/16/2016    MCV 85.2 05/16/2016   PLT 209 05/16/2016    Recent Labs Lab 05/16/16 1716  NA 137  K 3.7  CL 112*  CO2 20*  BUN 14  CREATININE 1.04  CALCIUM 9.0  GLUCOSE 200*   Lab Results  Component Value Date   TROPONINI 16.91* 04/03/2016      Radiology: Nm Myocar Multi W/spect W/wall Motion / Ef  05/14/2016   Defect 1: There is a large defect of moderate severity present in the mid anterior, mid anterolateral, apical anterior and apex location.  There was no ST segment deviation noted during stress.  Findings consistent with LAD ischemia.  This is an intermediate risk study.  The left ventricular ejection fraction is mildly decreased (45-54%).  Suboptimal study due to intense GI uptake.       ASSESSMENT AND PLAN:   1. Coronary artery disease involving native coronary arteries with class II angina: Currently on 1 antianginal medication. Nuclear stress test was abnormal  and moderate risk study. It showed anterior wall ischemia. Left heart catheterization and possible PCI was recommended. Risks and benefits were discussed. The patient is a ready on dual antiplatelet therapy.  2. Hyperlipidemia: Continue treatment with atorvastatin.  Signed:  Lorine Bears MD, [facility] 05/17/2016, 3:46 PM

## 2016-05-18 ENCOUNTER — Encounter: Payer: Self-pay | Admitting: Cardiovascular Disease

## 2016-05-21 ENCOUNTER — Telehealth: Payer: Self-pay | Admitting: Cardiovascular Disease

## 2016-05-21 NOTE — Telephone Encounter (Signed)
Received records request [employer], [facility]

## 2016-05-22 ENCOUNTER — Other Ambulatory Visit: Payer: Self-pay

## 2016-05-22 MED ORDER — ATORVASTATIN CALCIUM 40 MG PO TABS: 40.0000 mg | ORAL_TABLET | Freq: Every day | ORAL | Status: DC

## 2016-05-22 NOTE — Telephone Encounter (Signed)
Refill sent for Lipitor 80 mg

## 2016-05-23 ENCOUNTER — Telehealth: Payer: Self-pay | Admitting: Cardiovascular Disease

## 2016-05-23 NOTE — Telephone Encounter (Signed)
Disability form/[employer] in MD basket for completion.

## 2016-05-29 ENCOUNTER — Encounter (INDEPENDENT_AMBULATORY_CARE_PROVIDER_SITE_OTHER): Payer: Self-pay

## 2016-05-29 ENCOUNTER — Ambulatory Visit (INDEPENDENT_AMBULATORY_CARE_PROVIDER_SITE_OTHER): Payer: Managed Care, Other (non HMO)

## 2016-05-29 DIAGNOSIS — I493 Ventricular premature depolarization: Secondary | ICD-10-CM | POA: Diagnosis not present

## 2016-05-29 NOTE — Telephone Encounter (Signed)
[employer] form placed on Sabrina's desk

## 2016-05-31 ENCOUNTER — Telehealth: Payer: Self-pay | Admitting: Cardiovascular Disease

## 2016-05-31 NOTE — Telephone Encounter (Signed)
On 05/29/2016 sent completed forms to [employer].  Received today more forms from [employer], faxed to [employer] Attn [place]

## 2016-06-01 ENCOUNTER — Telehealth: Payer: Self-pay | Admitting: Cardiovascular Disease

## 2016-06-01 NOTE — Telephone Encounter (Signed)
Pt has called multiple times today and has spoke with me regarding his Disability paperwork. He turned them into us on 05/21/16 we stated to him and he received the release package where it said it may take up to 14 business days But patient was upset and said he needed this done now.  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  Stated to him we have also tried to call over there and lmov for them But no we also got no answer Pt was really upset and said he is going to sue us for all this that is going on. Again stated to him we told him in the beginning it would take up to 14 business days.  It has now been only 10 days.

## 2016-06-05 ENCOUNTER — Telehealth: Payer: Self-pay | Admitting: Cardiovascular Disease

## 2016-06-05 NOTE — Telephone Encounter (Signed)
On 06/05/16 sent completed forms to [employer]

## 2016-06-07 ENCOUNTER — Ambulatory Visit (INDEPENDENT_AMBULATORY_CARE_PROVIDER_SITE_OTHER): Payer: Managed Care, Other (non HMO) | Admitting: Cardiovascular Disease

## 2016-06-07 ENCOUNTER — Encounter: Payer: Self-pay | Admitting: Cardiovascular Disease

## 2016-06-07 VITALS — BP 126/74 | HR 52 | Ht 68.5 in | Wt 182.1 lb

## 2016-06-07 DIAGNOSIS — I25119 Atherosclerotic heart disease of native coronary artery with unspecified angina pectoris: Secondary | ICD-10-CM | POA: Diagnosis not present

## 2016-06-07 MED ORDER — ISOSORBIDE MONONITRATE ER 30 MG PO TB24: 30.0000 mg | ORAL_TABLET | Freq: Every day | ORAL | Status: DC

## 2016-06-07 MED ORDER — PANTOPRAZOLE SODIUM 40 MG PO TBEC: 40.0000 mg | DELAYED_RELEASE_TABLET | Freq: Every day | ORAL | Status: DC

## 2016-06-07 MED ORDER — TICAGRELOR 90 MG PO TABS: 90.0000 mg | ORAL_TABLET | Freq: Two times a day (BID) | ORAL | Status: DC

## 2016-06-07 MED ORDER — ATORVASTATIN CALCIUM 40 MG PO TABS: 40.0000 mg | ORAL_TABLET | Freq: Every day | ORAL | Status: DC

## 2016-06-07 MED ORDER — METOPROLOL TARTRATE 25 MG PO TABS: 25.0000 mg | ORAL_TABLET | Freq: Two times a day (BID) | ORAL | Status: DC

## 2016-06-07 NOTE — Patient Instructions (Signed)
Medication Instructions:  Your physician has recommended you make the following change in your medication:  START taking imdur 30mg  once daily   Labwork: none  Testing/Procedures: none  Follow-Up: Your physician recommends that you schedule a follow-up appointment in: one month with Dr. Kirke CorinArida.    Any Other Special Instructions Will Be Listed Below (If Applicable).     If you need a refill on your cardiac medications before your next appointment, please call your pharmacy.

## 2016-06-07 NOTE — Progress Notes (Signed)
Cardiology Office Note   Date:  06/08/2016   ID:  Jesse Soto, DOB 11/21/1955, MRN [id number]  PCP:  No PCP Per Patient  Cardiologist:   Jesse Bears, MD   Chief Complaint  Patient presents with  . Follow-up    2 months      History of Present Illness: Jesse Soto is a 61 y.o. male who presents for  a follow-up visit regarding coronary artery disease.  He has known history of hyperlipidemia, psoriasis and tobacco use. He presented in April 2017 with non-ST elevation myocardial infarction.  Cardiac catheterization showed 95% proximal RCA stenosis with very large thrombus, moderate mid RCA stenosis, occluded distal left circumflex supplying OM 3 territory with collaterals, occluded second diagonal and moderate mid LAD stenosis. EF was 45%. I performed successful aspiration thrombectomy and drug-eluting stent placement to the proximal right coronary artery. This was complicated by distal embolization into PL 1 and the patient was placed on Aggrastat infusion overnight. Echocardiogram after stent placement showed normal LV systolic function with no significant valvular abnormalities. He has recurrent chest pain as an outpatient . He underwent a treadmill nuclear stress test. The study was very suboptimal due to intense GI uptake but was suggestive of LAD ischemia. Thus, I proceeded with cardiac catheterization which showed patent RCA stent with unchanged disease. FFR interrogation was performed on the LAD and right coronary artery and both of them were nonsignificant. He was noted to have PVCs during the catheterization. Given his symptoms, I advised him to stay off work for at least a month in order to attend cardiac rehabilitation. He is going to start cardiac rehabilitation very soon. He continues to complain of exertional chest pain, dyspnea and palpitations.    Past Medical History  Diagnosis Date  . HLD (hyperlipidemia)   . Tobacco abuse   . Low back pain   . Psoriasis     . Abdominal hernia   . Coronary artery disease 03/2016     non-ST elevation myocardial infarction. Cardiac catheterization showed 95% proximal RCA stenosis with very large thrombus, moderate mid RCA stenosis, occluded distal left circumflex supplying OM 3 territory with collaterals and moderate mid LAD stenosis. EF was 45%. Successful aspiration thrombectomy and drug-eluting stent placement to the proximal right coronary artery     Past Surgical History  Procedure Laterality Date  . Hernia repair    . Cholecystectomy    . Knee surgery    . Peripheral vascular catheterization  04/02/2016    Procedure: Coronary Angiogram ;  Surgeon: Jesse Ouch, MD;  Location: [facility];  Service: Cardiovascular;;  . Cardiac catheterization N/A 04/02/2016    Procedure: Left Heart Cath;  Surgeon: Jesse Ouch, MD;  Location: [facility];  Service: Cardiovascular;  Laterality: N/A;  . Cardiac catheterization N/A 04/02/2016    Procedure: Coronary Stent Intervention;  Surgeon: Jesse Ouch, MD;  Location: [facility];  Service: Cardiovascular;  Laterality: N/A;  . Cardiac catheterization Left 05/17/2016    Procedure: Left Heart Cath and Coronary Angiography;  Surgeon: Jesse Ouch, MD;  Location: [facility];  Service: Cardiovascular;  Laterality: Left;  . Cardiac catheterization N/A 05/17/2016    Procedure: Intravascular Pressure Wire/FFR Study;  Surgeon: Jesse Ouch, MD;  Location: [facility];  Service: Cardiovascular;  Laterality: N/A;     Current Outpatient Prescriptions  Medication Sig Dispense Refill  . aspirin 81 MG chewable tablet Chew 1 tablet (  81 mg total) by mouth daily. 30 tablet 0  . atorvastatin (LIPITOR) 40 MG tablet Take 1 tablet (40 mg total) by mouth daily at 6 PM. 90 tablet 1  . metoprolol tartrate (LOPRESSOR) 25 MG tablet Take 1 tablet (25 mg total) by mouth 2 (two) times daily. 180 tablet 1  . nitroGLYCERIN (NITROSTAT) 0.4 MG  SL tablet Place 1 tablet (0.4 mg total) under the tongue every 5 (five) minutes as needed for chest pain. 25 tablet 3  . pantoprazole (PROTONIX) 40 MG tablet Take 1 tablet (40 mg total) by mouth daily. 90 tablet 1  . ticagrelor (BRILINTA) 90 MG TABS tablet Take 1 tablet (90 mg total) by mouth 2 (two) times daily. 180 tablet 1  . traMADol (ULTRAM) 50 MG tablet Take 50 mg by mouth daily.     . isosorbide mononitrate (IMDUR) 30 MG 24 hr tablet Take 1 tablet (30 mg total) by mouth daily. 90 tablet 1   No current facility-administered medications for this visit.    Allergies:   Fluticasone    Social History:  The patient  reports that he quit smoking about 2 months ago. His smoking use included Cigarettes. He has a 30 pack-year smoking history. He has never used smokeless tobacco. He reports that he drinks alcohol. He reports that he does not use illicit drugs.   Family History:  The patient's Family history is remarkable for hypertension   ROS:  Please see the history of present illness.   Otherwise, review of systems are positive for none.   All other systems are reviewed and negative.    PHYSICAL EXAM: VS:  BP 126/74 mmHg  Pulse 52  Ht 5' 8.5" (1.74 m)  Wt 182 lb 1.9 oz (82.609 kg)  BMI 27.29 kg/m2 , BMI Body mass index is 27.29 kg/(m^2). GEN: Well nourished, well developed, in no acute distress HEENT: normal Neck: no JVD, carotid bruits, or masses Cardiac: RRR; no murmurs, rubs, or gallops,no edema  Respiratory:  clear to auscultation bilaterally, normal work of breathing GI: soft, nontender, nondistended, + BS MS: no deformity or atrophy Skin: warm and dry, no rash Neuro:  Strength and sensation are intact Psych: euthymic mood, full affect Right radial pulse is normal with no hematoma  EKG:  EKG is not ordered today.    Recent Labs: 04/01/2016: Magnesium 2.0 05/10/2016: ALT 13 05/16/2016: BUN 14; Creatinine, Ser 1.04; Hemoglobin 15.0; Platelets 209; Potassium 3.7; Sodium 137     Lipid Panel    Component Value Date/Time   CHOL 132 05/10/2016 0835   CHOL 199 04/01/2016 1649   TRIG 108 05/10/2016 0835   HDL 33* 05/10/2016 0835   HDL 31* 04/01/2016 1649   CHOLHDL 4.0 05/10/2016 0835   CHOLHDL 6.4 04/01/2016 1649   VLDL 15 04/01/2016 1649   LDLCALC 77 05/10/2016 0835   LDLCALC 153* 04/01/2016 1649      Wt Readings from Last 3 Encounters:  06/07/16 182 lb 1.9 oz (82.609 kg)  05/17/16 183 lb (83.008 kg)  04/12/16 184 lb 6.4 oz (83.643 kg)        ASSESSMENT AND PLAN:  1.  Coronary artery disease involving native coronary arteries with other forms of angina:  He continues to have anginal symptoms suggestive of class II angina likely due to his residual coronary artery disease. I elected to add Imdur 30 mg once daily. He needs to start cardiac rehabilitation as soon as possible. Given his continued symptoms, I extended his return to work date  for another month until July 8.  2. PVCs: He does complain of palpitations. Continue treatment with metoprolol. If these worsen, Holter monitor can be considered.  2. Hyperlipidemia:  Myalgia improved after decreasing atorvastatin 40 mg daily. Most recent LDL was 77.  3. Tobacco use: The patient's quit smoking since his cardiac event.    Disposition:   FU with me in 1 months  Signed,  Jesse BearsMuhammad Arida, MD  06/08/2016 1:55 PM    [facility]

## 2016-06-08 ENCOUNTER — Telehealth: Payer: Self-pay | Admitting: Cardiovascular Disease

## 2016-06-08 ENCOUNTER — Ambulatory Visit
Admission: RE | Admit: 2016-06-08 | Discharge: 2016-06-08 | Disposition: A | Discharge status: Home / Self Care | Payer: Managed Care, Other (non HMO) | Source: Ambulatory Visit | Attending: Cardiovascular Disease | Admitting: Cardiovascular Disease

## 2016-06-08 DIAGNOSIS — L409 Psoriasis, unspecified: Secondary | ICD-10-CM | POA: Diagnosis not present

## 2016-06-08 DIAGNOSIS — R52 Pain, unspecified: Secondary | ICD-10-CM | POA: Insufficient documentation

## 2016-06-08 DIAGNOSIS — I2510 Atherosclerotic heart disease of native coronary artery without angina pectoris: Secondary | ICD-10-CM | POA: Diagnosis not present

## 2016-06-08 DIAGNOSIS — E785 Hyperlipidemia, unspecified: Secondary | ICD-10-CM | POA: Diagnosis not present

## 2016-06-08 DIAGNOSIS — I25119 Atherosclerotic heart disease of native coronary artery with unspecified angina pectoris: Secondary | ICD-10-CM | POA: Insufficient documentation

## 2016-06-08 DIAGNOSIS — Z720 Tobacco use: Secondary | ICD-10-CM | POA: Diagnosis not present

## 2016-06-08 DIAGNOSIS — I252 Old myocardial infarction: Secondary | ICD-10-CM | POA: Diagnosis not present

## 2016-06-08 DIAGNOSIS — Z9861 Coronary angioplasty status: Secondary | ICD-10-CM | POA: Insufficient documentation

## 2016-06-08 DIAGNOSIS — R531 Weakness: Secondary | ICD-10-CM | POA: Insufficient documentation

## 2016-06-08 DIAGNOSIS — Z09 Encounter for follow-up examination after completed treatment for conditions other than malignant neoplasm: Secondary | ICD-10-CM | POA: Diagnosis present

## 2016-06-08 NOTE — Telephone Encounter (Signed)
S/w pt to inform him [employer] Short [employer] Disability paperwork is ready for pick up. He will pick it up after cardiac rehab Monday.. [place] had to park 1/2 mile from his destination and walk uphill. Reports after getting to the building, he had to return to his car. As he returned to the building again, he became very short of breath. States he had to sit down for 15 minutes before being able to ambulate w/o exertion. Advised pt to keep Cardiac Rehab appt as this should help his endurance and to take breaks when walking longer distances. Pt verbalized understanding and is agreeable w/plan. Paperwork placed at the front desk.

## 2016-06-11 ENCOUNTER — Encounter: Payer: Managed Care, Other (non HMO) | Attending: Cardiovascular Disease | Admitting: *Deleted

## 2016-06-11 ENCOUNTER — Encounter: Payer: Self-pay | Admitting: *Deleted

## 2016-06-11 VITALS — Ht 68.5 in | Wt 184.5 lb

## 2016-06-11 DIAGNOSIS — I214 Non-ST elevation (NSTEMI) myocardial infarction: Secondary | ICD-10-CM | POA: Diagnosis present

## 2016-06-11 DIAGNOSIS — Z9861 Coronary angioplasty status: Secondary | ICD-10-CM

## 2016-06-11 NOTE — Patient Instructions (Signed)
Patient Instructions  Patient Details  Name: Jesse Soto MRN: [id number] Date of Birth: Jan 21, 1955 Referring Provider:  Iran OuchArida, Muhammad A, MD  Below are the personal goals you chose as well as exercise and nutrition goals. Our goal is to help you keep on track towards obtaining and maintaining your goals. We will be discussing your progress on these goals with you throughout the program.  Initial Exercise Prescription:     Initial Exercise Prescription - 06/11/16 1200    Date of Initial Exercise RX and Referring Provider   Date 06/11/16   Referring Provider Lorine BearsArida, Muhammad MD   Treadmill   MPH 2.2   Grade 1   Minutes 15   METs 2.99   NuStep   Level 3   Watts 30   Minutes 15   METs 2   Recumbant Elliptical   Level 3   RPM 50   Watts 30   Minutes 15   METs 2   REL-XR   Level 2   Watts 30   Minutes 15   METs 2   T5 Nustep   Level 2   Watts 30   Minutes 15   METs 2   Biostep-RELP   Level 3   Watts 30   Minutes 15   METs 2   Prescription Details   Frequency (times per week) 3   Duration Progress to 30 minutes of continuous aerobic without signs/symptoms of physical distress   Intensity   THRR 40-80% of Max Heartrate 93-138   Ratings of Perceived Exertion 11-13   Perceived Dyspnea 0-4   Progression   Progression Continue to progress workloads to maintain intensity without signs/symptoms of physical distress.   Resistance Training   Training Prescription Yes   Weight 2   Reps 10-12      Exercise Goals: Frequency: Be able to perform aerobic exercise three times per week working toward 3-5 days per week.  Intensity: Work with a perceived exertion of 11 (fairly light) - 15 (hard) as tolerated. Follow your new exercise prescription and watch for changes in prescription as you progress with the program. Changes will be reviewed with you when they are made.  Duration: You should be able to do 30 minutes of continuous aerobic exercise in addition to a 5 minute  warm-up and a 5 minute cool-down routine.  Nutrition Goals: Your personal nutrition goals will be established when you do your nutrition analysis with the dietician.  The following are nutrition guidelines to follow: Cholesterol < 200mg /day Sodium < 1500mg /day Fiber: Men over 50 yrs - 30 grams per day  Personal Goals:     Personal Goals and Risk Factors at Admission - 06/11/16 1316    Core Components/Risk Factors/Patient Goals on Admission    Weight Management Yes;Weight Maintenance   Intervention Weight Management: Provide education and appropriate resources to help participant work on and attain dietary goals.;Weight Management: Develop a combined nutrition and exercise program designed to reach desired caloric intake, while maintaining appropriate intake of nutrient and fiber, sodium and fats, and appropriate energy expenditure required for the weight goal.   Admit Weight 184 lb 8 oz (83.689 kg)   Goal Weight: Short Term 184 lb 8 oz (83.689 kg)   Goal Weight: Long Term 184 lb 8 oz (83.689 kg)   Expected Outcomes Weight Maintenance: Understanding of the daily nutrition guidelines, which includes 25-35% calories from fat, 7% or less cal from saturated fats, less than 200mg  cholesterol, less than 1.5gm of sodium, &  5 or more servings of fruits and vegetables daily   Sedentary Yes   Intervention Provide advice, education, support and counseling about physical activity/exercise needs.;Develop an individualized exercise prescription for aerobic and resistive training based on initial evaluation findings, risk stratification, comorbidities and participant's personal goals.   Expected Outcomes Achievement of increased cardiorespiratory fitness and enhanced flexibility, muscular endurance and strength shown through measurements of functional capacity and personal statement of participant.   Increase Strength and Stamina Yes   Intervention Provide advice, education, support and counseling about  physical activity/exercise needs.;Develop an individualized exercise prescription for aerobic and resistive training based on initial evaluation findings, risk stratification, comorbidities and participant's personal goals.   Expected Outcomes Achievement of increased cardiorespiratory fitness and enhanced flexibility, muscular endurance and strength shown through measurements of functional capacity and personal statement of participant.   Hypertension Yes   Intervention Provide education on lifestyle modifcations including regular physical activity/exercise, weight management, moderate sodium restriction and increased consumption of fresh fruit, vegetables, and low fat dairy, alcohol moderation, and smoking cessation.;Monitor prescription use compliance.   Expected Outcomes Short Term: Continued assessment and intervention until BP is < 140/60mm HG in hypertensive participants. < 130/39mm HG in hypertensive participants with diabetes, heart failure or chronic kidney disease.;Long Term: Maintenance of blood pressure at goal levels.   Lipids Yes   Intervention Provide education and support for participant on nutrition & aerobic/resistive exercise along with prescribed medications to achieve LDL 70mg , HDL >40mg .   Expected Outcomes Short Term: Participant states understanding of desired cholesterol values and is compliant with medications prescribed. Participant is following exercise prescription and nutrition guidelines.;Long Term: Cholesterol controlled with medications as prescribed, with individualized exercise RX and with personalized nutrition plan. Value goals: LDL < , HDL > 40 mg.      Tobacco Use Initial Evaluation: History  Smoking status  . Former Smoker -- 1.00 packs/day for 30 years  . Types: Cigarettes  . Quit date: 04/01/2016  Smokeless tobacco  . Never Used    Copy of goals given to participant.

## 2016-06-11 NOTE — Progress Notes (Signed)
Cardiac Individual Treatment Plan  Patient Details  Name: Jesse Soto MRN: [id number] Date of Birth: 09-05-1955 Referring Provider:        Cardiac Rehab from 06/11/2016 in [facility] Cardiac and Pulmonary Rehab   Referring Provider  Lorine Bears MD      Initial Encounter Date:       Cardiac Rehab from 06/11/2016 in [facility] Cardiac and Pulmonary Rehab   Date  06/11/16   Referring Provider  Lorine Bears MD      Visit Diagnosis: NSTEMI (non-ST elevated myocardial infarction) [facility])  History of percutaneous coronary intervention  Patient's Home Medications on Admission:  Current outpatient prescriptions:  .  aspirin 81 MG chewable tablet, Chew 1 tablet (81 mg total) by mouth daily., Disp: 30 tablet, Rfl: 0 .  atorvastatin (LIPITOR) 40 MG tablet, Take 1 tablet (40 mg total) by mouth daily at 6 PM., Disp: 90 tablet, Rfl: 1 .  isosorbide mononitrate (IMDUR) 30 MG 24 hr tablet, Take 1 tablet (30 mg total) by mouth daily., Disp: 90 tablet, Rfl: 1 .  metoprolol tartrate (LOPRESSOR) 25 MG tablet, Take 1 tablet (25 mg total) by mouth 2 (two) times daily., Disp: 180 tablet, Rfl: 1 .  nitroGLYCERIN (NITROSTAT) 0.4 MG SL tablet, Place 1 tablet (0.4 mg total) under the tongue every 5 (five) minutes as needed for chest pain., Disp: 25 tablet, Rfl: 3 .  pantoprazole (PROTONIX) 40 MG tablet, Take 1 tablet (40 mg total) by mouth daily., Disp: 90 tablet, Rfl: 1 .  ticagrelor (BRILINTA) 90 MG TABS tablet, Take 1 tablet (90 mg total) by mouth 2 (two) times daily., Disp: 180 tablet, Rfl: 1 .  traMADol (ULTRAM) 50 MG tablet, Take 50 mg by mouth daily. , Disp: , Rfl:   Past Medical History: Past Medical History  Diagnosis Date  . HLD (hyperlipidemia)   . Tobacco abuse   . Low back pain   . Psoriasis   . Abdominal hernia   . Coronary artery disease 03/2016     non-ST elevation myocardial infarction. Cardiac catheterization showed 95% proximal RCA stenosis with very large thrombus, moderate mid RCA  stenosis, occluded distal left circumflex supplying OM 3 territory with collaterals and moderate mid LAD stenosis. EF was 45%. Successful aspiration thrombectomy and drug-eluting stent placement to the proximal right coronary artery     Tobacco Use: History  Smoking status  . Former Smoker -- 1.00 packs/day for 30 years  . Types: Cigarettes  . Quit date: 04/01/2016  Smokeless tobacco  . Never Used    Labs: Recent Review Flowsheet Data    Labs for ITP Cardiac and Pulmonary Rehab Latest Ref Rng 04/01/2016 05/10/2016   Cholestrol 100 - 199 mg/dL 409 811   LDLCALC 0 - 99 mg/dL 914(N) 77   HDL >82 mg/dL 95(A) 21(H)   Trlycerides 0 - 149 mg/dL 76 086   Hemoglobin V7Q 4.0 - 6.0 % 5.7 -       Exercise Target Goals: Date: 06/11/16  Exercise Program Goal: Individual exercise prescription set with THRR, safety & activity barriers. Participant demonstrates ability to understand and report RPE using BORG scale, to self-measure pulse accurately, and to acknowledge the importance of the exercise prescription.  Exercise Prescription Goal: Starting with aerobic activity 30 plus minutes a day, 3 days per week for initial exercise prescription. Provide home exercise prescription and guidelines that participant acknowledges understanding prior to discharge.  Activity Barriers & Risk Stratification:     Activity Barriers & Cardiac Risk Stratification -  06/11/16 1306    Activity Barriers & Cardiac Risk Stratification   Activity Barriers --  Patient reports having had right knee cartilage removed while in the [employer].  States right knee "floats" requiring him to wear a brace.  As a result, this affects his lower back - two vertebrae are irritated due to his right knee problems.        6 Minute Walk:     6 Minute Walk      06/11/16 1249       6 Minute Walk   Phase Initial     Distance 1300 feet     Walk Time 6 minutes     # of Rest Breaks 0     MPH 2.46     METS 3.65     RPE 13      Perceived Dyspnea  2     VO2 Peak 12.77     Symptoms No     Resting HR 47 bpm     Resting BP 122/70 mmHg     Max Ex. HR 113 bpm     Max Ex. BP 132/68 mmHg        Initial Exercise Prescription:     Initial Exercise Prescription - 06/11/16 1200    Date of Initial Exercise RX and Referring Provider   Date 06/11/16   Referring Provider Lorine BearsArida, Muhammad MD   Treadmill   MPH 2.2   Grade 1   Minutes 15   METs 2.99   NuStep   Level 3   Watts 30   Minutes 15   METs 2   Recumbant Elliptical   Level 3   RPM 50   Watts 30   Minutes 15   METs 2   REL-XR   Level 2   Watts 30   Minutes 15   METs 2   T5 Nustep   Level 2   Watts 30   Minutes 15   METs 2   Biostep-RELP   Level 3   Watts 30   Minutes 15   METs 2   Prescription Details   Frequency (times per week) 3   Duration Progress to 30 minutes of continuous aerobic without signs/symptoms of physical distress   Intensity   THRR 40-80% of Max Heartrate 93-138   Ratings of Perceived Exertion 11-13   Perceived Dyspnea 0-4   Progression   Progression Continue to progress workloads to maintain intensity without signs/symptoms of physical distress.   Resistance Training   Training Prescription Yes   Weight 2   Reps 10-12      Perform Capillary Blood Glucose checks as needed.  Exercise Prescription Changes:   Exercise Comments:     Exercise Comments      06/11/16 1255           Exercise Comments Wears knee brace for extra support.  He would like to increase his stamina while here in program.          Discharge Exercise Prescription (Final Exercise Prescription Changes):   Nutrition:  Target Goals: Understanding of nutrition guidelines, daily intake of sodium 1500mg , cholesterol 200mg , calories 30% from fat and 7% or less from saturated fats, daily to have 5 or more servings of fruits and vegetables.  Biometrics:     Pre Biometrics - 06/11/16 1253    Pre Biometrics   Height 5' 8.5" (1.74 m)    Weight 184 lb 8 oz (83.689 kg)   Waist Circumference 40 inches  Hip Circumference 40 inches   Waist to Hip Ratio 1 %   BMI (Calculated) 27.7       Nutrition Therapy Plan and Nutrition Goals:   Nutrition Discharge: Rate Your Plate Scores:   Nutrition Goals Re-Evaluation:   Psychosocial: Target Goals: Acknowledge presence or absence of depression, maximize coping skills, provide positive support system. Participant is able to verbalize types and ability to use techniques and skills needed for reducing stress and depression.  Initial Review & Psychosocial Screening:     Initial Psych Review & Screening - 06/11/16 1321    Initial Review   Current issues with --  Ivon denies depression or history of depression.     Family Dynamics   Good Support System? Yes  Geoffery states his wife Carollee Herter is very supportive.  They have 2 children and no grandchildren.     Barriers   Psychosocial barriers to participate in program There are no identifiable barriers or psychosocial needs.   Screening Interventions   Interventions Encouraged to exercise;Program counselor consult      Quality of Life Scores:     Quality of Life - 06/11/16 1323    Quality of Life Scores   Health/Function Pre 21.2 %   Socioeconomic Pre 26.25 %   Psych/Spiritual Pre 30 %   Family Pre 27.6 %   GLOBAL Pre 25.03 %      PHQ-9:     Recent Review Flowsheet Data    Depression screen [facility] 2/9 06/11/2016   Decreased Interest 0   Down, Depressed, Hopeless 0   PHQ - 2 Score 0   Altered sleeping 0   Tired, decreased energy 1   Change in appetite 0   Feeling bad or failure about yourself  0   Trouble concentrating 0   Moving slowly or fidgety/restless 1   Suicidal thoughts 0   PHQ-9 Score 2   Difficult doing work/chores Somewhat difficult      Psychosocial Evaluation and Intervention:   Psychosocial Re-Evaluation:   Vocational Rehabilitation: Provide vocational rehab assistance to qualifying  candidates.   Vocational Rehab Evaluation & Intervention:     Vocational Rehab - 06/11/16 1313    Initial Vocational Rehab Evaluation & Intervention   Assessment shows need for Vocational Rehabilitation No      Education: Education Goals: Education classes will be provided on a weekly basis, covering required topics. Participant will state understanding/return demonstration of topics presented.  Learning Barriers/Preferences:     Learning Barriers/Preferences - 06/11/16 1309    Learning Barriers/Preferences   Learning Barriers Sight;Exercise Concerns  Patient reports vision has changed since MI.  Seeing Ophthalmologist on June 15th, 2017.     Learning Preferences None      Education Topics: General Nutrition Guidelines/Fats and Fiber: -Group instruction provided by verbal, written material, models and posters to present the general guidelines for heart healthy nutrition. Gives an explanation and review of dietary fats and fiber.   Controlling Sodium/Reading Food Labels: -Group verbal and written material supporting the discussion of sodium use in heart healthy nutrition. Review and explanation with models, verbal and written materials for utilization of the food label.   Exercise Physiology & Risk Factors: - Group verbal and written instruction with models to review the exercise physiology of the cardiovascular system and associated critical values. Details cardiovascular disease risk factors and the goals associated with each risk factor.   Aerobic Exercise & Resistance Training: - Gives group verbal and written discussion on the health impact of inactivity.  On the components of aerobic and resistive training programs and the benefits of this training and how to safely progress through these programs.   Flexibility, Balance, General Exercise Guidelines: - Provides group verbal and written instruction on the benefits of flexibility and balance training programs. Provides  general exercise guidelines with specific guidelines to those with heart or lung disease. Demonstration and skill practice provided.   Stress Management: - Provides group verbal and written instruction about the health risks of elevated stress, cause of high stress, and healthy ways to reduce stress.   Depression: - Provides group verbal and written instruction on the correlation between heart/lung disease and depressed mood, treatment options, and the stigmas associated with seeking treatment.   Anatomy & Physiology of the Heart: - Group verbal and written instruction and models provide basic cardiac anatomy and physiology, with the coronary electrical and arterial systems. Review of: AMI, Angina, Valve disease, Heart Failure, Cardiac Arrhythmia, Pacemakers, and the ICD.   Cardiac Procedures: - Group verbal and written instruction and models to describe the testing methods done to diagnose heart disease. Reviews the outcomes of the test results. Describes the treatment choices: Medical Management, Angioplasty, or Coronary Bypass Surgery.   Cardiac Medications: - Group verbal and written instruction to review commonly prescribed medications for heart disease. Reviews the medication, class of the drug, and side effects. Includes the steps to properly store meds and maintain the prescription regimen.   Go Sex-Intimacy & Heart Disease, Get SMART - Goal Setting: - Group verbal and written instruction through game format to discuss heart disease and the return to sexual intimacy. Provides group verbal and written material to discuss and apply goal setting through the application of the S.M.A.R.T. Method.   Other Matters of the Heart: - Provides group verbal, written materials and models to describe Heart Failure, Angina, Valve Disease, and Diabetes in the realm of heart disease. Includes description of the disease process and treatment options available to the cardiac patient.   Exercise &  Equipment Safety: - Individual verbal instruction and demonstration of equipment use and safety with use of the equipment.          Cardiac Rehab from 06/11/2016 in [facility] Cardiac and Pulmonary Rehab   Date  06/11/16   Educator  D.W.   Instruction Review Code  1- partially meets, needs review/practice      Infection Prevention: - Provides verbal and written material to individual with discussion of infection control including proper hand washing and proper equipment cleaning during exercise session.      Cardiac Rehab from 06/11/2016 in [facility] Cardiac and Pulmonary Rehab   Date  06/11/16   Educator  DW   Instruction Review Code  2- meets goals/outcomes      Falls Prevention: - Provides verbal and written material to individual with discussion of falls prevention and safety.      Cardiac Rehab from 06/11/2016 in [facility] Cardiac and Pulmonary Rehab   Date  06/11/16   Educator  DW   Instruction Review Code  2- meets goals/outcomes      Diabetes: - Individual verbal and written instruction to review signs/symptoms of diabetes, desired ranges of glucose level fasting, after meals and with exercise. Advice that pre and post exercise glucose checks will be done for 3 sessions at entry of program.    Knowledge Questionnaire Score:     Knowledge Questionnaire Score - 06/11/16 1313    Knowledge Questionnaire Score   Pre Score 21/28  Core Components/Risk Factors/Patient Goals at Admission:     Personal Goals and Risk Factors at Admission - 06/11/16 1316    Core Components/Risk Factors/Patient Goals on Admission    Weight Management Yes;Weight Maintenance   Intervention Weight Management: Provide education and appropriate resources to help participant work on and attain dietary goals.;Weight Management: Develop a combined nutrition and exercise program designed to reach desired caloric intake, while maintaining appropriate intake of nutrient and fiber, sodium and fats, and appropriate  energy expenditure required for the weight goal.   Admit Weight 184 lb 8 oz (83.689 kg)   Goal Weight: Short Term 184 lb 8 oz (83.689 kg)   Goal Weight: Long Term 184 lb 8 oz (83.689 kg)   Expected Outcomes Weight Maintenance: Understanding of the daily nutrition guidelines, which includes 25-35% calories from fat, 7% or less cal from saturated fats, less than  cholesterol, less than 1.5gm of sodium, & 5 or more servings of fruits and vegetables daily   Sedentary Yes   Intervention Provide advice, education, support and counseling about physical activity/exercise needs.;Develop an individualized exercise prescription for aerobic and resistive training based on initial evaluation findings, risk stratification, comorbidities and participant's personal goals.   Expected Outcomes Achievement of increased cardiorespiratory fitness and enhanced flexibility, muscular endurance and strength shown through measurements of functional capacity and personal statement of participant.   Increase Strength and Stamina Yes   Intervention Provide advice, education, support and counseling about physical activity/exercise needs.;Develop an individualized exercise prescription for aerobic and resistive training based on initial evaluation findings, risk stratification, comorbidities and participant's personal goals.   Expected Outcomes Achievement of increased cardiorespiratory fitness and enhanced flexibility, muscular endurance and strength shown through measurements of functional capacity and personal statement of participant.   Hypertension Yes   Intervention Provide education on lifestyle modifcations including regular physical activity/exercise, weight management, moderate sodium restriction and increased consumption of fresh fruit, vegetables, and low fat dairy, alcohol moderation, and smoking cessation.;Monitor prescription use compliance.   Expected Outcomes Short Term: Continued assessment and intervention  until BP is < 140/31mm HG in hypertensive participants. < 130/18mm HG in hypertensive participants with diabetes, heart failure or chronic kidney disease.;Long Term: Maintenance of blood pressure at goal levels.   Lipids Yes   Intervention Provide education and support for participant on nutrition & aerobic/resistive exercise along with prescribed medications to achieve LDL 70mg , HDL >40mg .   Expected Outcomes Short Term: Participant states understanding of desired cholesterol values and is compliant with medications prescribed. Participant is following exercise prescription and nutrition guidelines.;Long Term: Cholesterol controlled with medications as prescribed, with individualized exercise RX and with personalized nutrition plan. Value goals: LDL < , HDL > 40 mg.      Core Components/Risk Factors/Patient Goals Review:    Core Components/Risk Factors/Patient Goals at Discharge (Final Review):    ITP Comments:   Comments: Daviel plans to start Cardiac Rehab on June 13, 2016 at 4 p.m.  Brenson stopped smoking on the day he had a heart attack, April 2nd, 2017.  He states he will never smoke again.  He is already noticing how his sense of smell has improved and how being around other people smoking affects him.  He does not want to be near someone smoking.

## 2016-06-12 ENCOUNTER — Telehealth: Payer: Self-pay | Admitting: Cardiovascular Disease

## 2016-06-12 NOTE — Telephone Encounter (Signed)
S/w Arline AspCindy who is working on pt disability paperwork and inquires of pt return to work date. Per MD notes from June 8, OV: "Given his continued symptoms, I extended his return to work date for another month until July 8."  Cindy verbalized understanding and is appreciative of the return call. She had no further questions at this time.

## 2016-06-12 NOTE — Telephone Encounter (Signed)
Cindy nurse case manager working on this patient disabiliy claim Would just like to know the day patient is to return back to work. I have faxed over the cath results.  She states she is there until 5 pm central time and has a confidential voicemail  Please call back.

## 2016-06-13 ENCOUNTER — Encounter: Payer: Managed Care, Other (non HMO) | Admitting: *Deleted

## 2016-06-13 DIAGNOSIS — Z9861 Coronary angioplasty status: Secondary | ICD-10-CM

## 2016-06-13 DIAGNOSIS — I214 Non-ST elevation (NSTEMI) myocardial infarction: Secondary | ICD-10-CM | POA: Diagnosis not present

## 2016-06-13 NOTE — Progress Notes (Signed)
Daily Session Note  Patient Details  Name: JAHMEL FLANNAGAN MRN: [id number] Date of Birth: [birth year] Referring Provider:        Cardiac Rehab from 06/11/2016 in [facility] Cardiac and Pulmonary Rehab   Referring Provider  Kathlyn Sacramento MD      Encounter Date: 06/13/2016  Check In:     Session Check In - 06/13/16 1631    Check-In   Staff Present Heath Lark, RN, BSN, Lance Sell, BA, ACSM CEP, Exercise Physiologist;Diane Joya Gaskins, RN, BSN   Supervising physician immediately available to respond to emergencies See telemetry face sheet for immediately available ER MD   Medication changes reported     No   Fall or balance concerns reported    No   Warm-up and Cool-down Performed on first and last piece of equipment   VAD Patient? No   Pain Assessment   Currently in Pain? No/denies         Goals Met:  Exercise tolerated well No report of cardiac concerns or symptoms Strength training completed today  Goals Unmet:  Not Applicable  Comments: first seeion since med review    Tolerated exercise well   Dr. Emily Filbert is Medical Director for [place] and [facility].

## 2016-06-14 DIAGNOSIS — Z9861 Coronary angioplasty status: Secondary | ICD-10-CM

## 2016-06-14 DIAGNOSIS — I214 Non-ST elevation (NSTEMI) myocardial infarction: Secondary | ICD-10-CM

## 2016-06-14 NOTE — Progress Notes (Signed)
Daily Session Note  Patient Details  Name: Jesse Soto MRN: [id number] Date of Birth: February 04, 1955 Referring Provider:        Cardiac Rehab from 06/11/2016 in [facility] Cardiac and Pulmonary Rehab   Referring Provider  Kathlyn Sacramento MD      Encounter Date: 06/14/2016  Check In:     Session Check In - 06/14/16 1637    Check-In   Location [facility]-Cardiac & Pulmonary Rehab   Staff Present Gerlene Burdock, RN, Vickki Hearing, BA, ACSM CEP, Exercise Physiologist;Diane Joya Gaskins, RN, BSN   Supervising physician immediately available to respond to emergencies See telemetry face sheet for immediately available ER MD   Medication changes reported     No   Fall or balance concerns reported    No   Warm-up and Cool-down Performed on first and last piece of equipment   Resistance Training Performed Yes   VAD Patient? No   Pain Assessment   Currently in Pain? No/denies   Multiple Pain Sites No         Goals Met:  Independence with exercise equipment Exercise tolerated well No report of cardiac concerns or symptoms Strength training completed today  Goals Unmet:  Not Applicable  Comments: Pt able to follow exercise prescription today without complaint.  Will continue to monitor for progression.    Dr. Emily Filbert is Medical Director for [place] and [facility].

## 2016-06-20 ENCOUNTER — Encounter: Payer: Self-pay | Admitting: *Deleted

## 2016-06-20 DIAGNOSIS — I214 Non-ST elevation (NSTEMI) myocardial infarction: Secondary | ICD-10-CM | POA: Diagnosis not present

## 2016-06-20 DIAGNOSIS — Z9861 Coronary angioplasty status: Secondary | ICD-10-CM

## 2016-06-20 NOTE — Progress Notes (Signed)
Cardiac Individual Treatment Plan  Patient Details  Name: GLEASON ARDOIN MRN: [id number] Date of Birth: 09/04/1964 Referring Provider:        Cardiac Rehab from 06/11/2016 in [facility] Cardiac and Pulmonary Rehab   Referring Provider  Kathlyn Sacramento MD      Initial Encounter Date:       Cardiac Rehab from 06/11/2016 in [facility] Cardiac and Pulmonary Rehab   Date  06/11/16   Referring Provider  Kathlyn Sacramento MD      Visit Diagnosis: NSTEMI (non-ST elevated myocardial infarction) ([place])  S/P PTCA (percutaneous transluminal coronary angioplasty)  Patient's Home Medications on Admission:  Current outpatient prescriptions:  .  aspirin 81 MG chewable tablet, Chew 1 tablet (81 mg total) by mouth daily., Disp: 30 tablet, Rfl: 0 .  atorvastatin (LIPITOR) 40 MG tablet, Take 1 tablet (40 mg total) by mouth daily at 6 PM., Disp: 90 tablet, Rfl: 1 .  isosorbide mononitrate (IMDUR) 30 MG 24 hr tablet, Take 1 tablet (30 mg total) by mouth daily., Disp: 90 tablet, Rfl: 1 .  metoprolol tartrate (LOPRESSOR) 25 MG tablet, Take 1 tablet (25 mg total) by mouth 2 (two) times daily., Disp: 180 tablet, Rfl: 1 .  nitroGLYCERIN (NITROSTAT) 0.4 MG SL tablet, Place 1 tablet (0.4 mg total) under the tongue every 5 (five) minutes as needed for chest pain., Disp: 25 tablet, Rfl: 3 .  pantoprazole (PROTONIX) 40 MG tablet, Take 1 tablet (40 mg total) by mouth daily., Disp: 90 tablet, Rfl: 1 .  ticagrelor (BRILINTA) 90 MG TABS tablet, Take 1 tablet (90 mg total) by mouth 2 (two) times daily., Disp: 180 tablet, Rfl: 1 .  traMADol (ULTRAM) 50 MG tablet, Take 50 mg by mouth daily. , Disp: , Rfl:   Past Medical History: Past Medical History  Diagnosis Date  . HLD (hyperlipidemia)   . Tobacco abuse   . Low back pain   . Psoriasis   . Abdominal hernia   . Coronary artery disease 03/2016     non-ST elevation myocardial infarction. Cardiac catheterization showed 95% proximal RCA stenosis with very large thrombus, moderate  mid RCA stenosis, occluded distal left circumflex supplying OM 3 territory with collaterals and moderate mid LAD stenosis. EF was 45%. Successful aspiration thrombectomy and drug-eluting stent placement to the proximal right coronary artery     Tobacco Use: History  Smoking status  . Former Smoker -- 1.00 packs/day for 30 years  . Types: Cigarettes  . Quit date: 04/01/2016  Smokeless tobacco  . Never Used    Labs: Recent Review Flowsheet Data    Labs for ITP Cardiac and Pulmonary Rehab Latest Ref Rng 04/01/2016 05/10/2016   Cholestrol 100 - 199 mg/dL 199 132   LDLCALC 0 - 99 mg/dL 153(H) 77   HDL >39 mg/dL 31(L) 33(L)   Trlycerides 0 - 149 mg/dL 76 108   Hemoglobin A1c 4.0 - 6.0 % 5.7 -       Exercise Target Goals:    Exercise Program Goal: Individual exercise prescription set with THRR, safety & activity barriers. Participant demonstrates ability to understand and report RPE using BORG scale, to self-measure pulse accurately, and to acknowledge the importance of the exercise prescription.  Exercise Prescription Goal: Starting with aerobic activity 30 plus minutes a day, 3 days per week for initial exercise prescription. Provide home exercise prescription and guidelines that participant acknowledges understanding prior to discharge.  Activity Barriers & Risk Stratification:     Activity Barriers & Cardiac Risk Stratification -  06/11/16 1306    Activity Barriers & Cardiac Risk Stratification   Activity Barriers --  Patient reports having had right knee cartilage removed while in the [employer].  States right knee "floats" requiring him to wear a brace.  As a result, this affects his lower back - two vertebrae are irritated due to his right knee problems.        6 Minute Walk:     6 Minute Walk      06/11/16 1249       6 Minute Walk   Phase Initial     Distance 1300 feet     Walk Time 6 minutes     # of Rest Breaks 0     MPH 2.46     METS 3.65     RPE 13     Perceived  Dyspnea  2     VO2 Peak 12.77     Symptoms No     Resting HR 47 bpm     Resting BP 122/70 mmHg     Max Ex. HR 113 bpm     Max Ex. BP 132/68 mmHg        Initial Exercise Prescription:     Initial Exercise Prescription - 06/11/16 1200    Date of Initial Exercise RX and Referring Provider   Date 06/11/16   Referring Provider Kathlyn Sacramento MD   Treadmill   MPH 2.2   Grade 1   Minutes 15   METs 2.99   NuStep   Level 3   Watts 30   Minutes 15   METs 2   Recumbant Elliptical   Level 3   RPM 50   Watts 30   Minutes 15   METs 2   REL-XR   Level 2   Watts 30   Minutes 15   METs 2   T5 Nustep   Level 2   Watts 30   Minutes 15   METs 2   Biostep-RELP   Level 3   Watts 30   Minutes 15   METs 2   Prescription Details   Frequency (times per week) 3   Duration Progress to 30 minutes of continuous aerobic without signs/symptoms of physical distress   Intensity   THRR 40-80% of Max Heartrate 93-138   Ratings of Perceived Exertion 11-13   Perceived Dyspnea 0-4   Progression   Progression Continue to progress workloads to maintain intensity without signs/symptoms of physical distress.   Resistance Training   Training Prescription Yes   Weight 2   Reps 10-12      Perform Capillary Blood Glucose checks as needed.  Exercise Prescription Changes:     Exercise Prescription Changes      06/15/16 1000           Exercise Review   Progression Yes       Response to Exercise   Blood Pressure (Admit) 134/80 mmHg       Blood Pressure (Exercise) 148/72 mmHg       Blood Pressure (Exit) 134/68 mmHg       Heart Rate (Admit) 66 bpm       Heart Rate (Exercise) 81 bpm       Heart Rate (Exit) 63 bpm       Rating of Perceived Exertion (Exercise) 15       Resistance Training   Training Prescription Yes       Weight 2       Reps 10-12  Treadmill   MPH 1.9       Grade 0       Minutes 8       METs 2.45       NuStep   Level 4       Watts 24       Minutes 20        METs 2       REL-XR   Level 1       Watts 69       Minutes 10       METs 3.8          Exercise Comments:     Exercise Comments      06/11/16 1255 06/13/16 1743 06/15/16 1040       Exercise Comments Wears knee brace for extra support.  He would like to increase his stamina while here in program. First full day of exercise!  Patient was oriented to gym and equipment including functions, settings, policies, and procedures.  Patient's individual exercise prescription and treatment plan were reviewed.  All starting workloads were established based on the results of the 6 minute walk test done at initial orientation visit.  The plan for exercise progression was also introduced and progression will be customized based on patient's performance and goals. Florian has completed 2 days of exercise and has tolerated sessions well.        Discharge Exercise Prescription (Final Exercise Prescription Changes):     Exercise Prescription Changes - 06/15/16 1000    Exercise Review   Progression Yes   Response to Exercise   Blood Pressure (Admit) 134/80 mmHg   Blood Pressure (Exercise) 148/72 mmHg   Blood Pressure (Exit) 134/68 mmHg   Heart Rate (Admit) 66 bpm   Heart Rate (Exercise) 81 bpm   Heart Rate (Exit) 63 bpm   Rating of Perceived Exertion (Exercise) 15   Resistance Training   Training Prescription Yes   Weight 2   Reps 10-12   Treadmill   MPH 1.9   Grade 0   Minutes 8   METs 2.45   NuStep   Level 4   Watts 24   Minutes 20   METs 2   REL-XR   Level 1   Watts 69   Minutes 10   METs 3.8      Nutrition:  Target Goals: Understanding of nutrition guidelines, daily intake of sodium <1532m, cholesterol <2044m calories 30% from fat and 7% or less from saturated fats, daily to have 5 or more servings of fruits and vegetables.  Biometrics:     Pre Biometrics - 06/11/16 1253    Pre Biometrics   Height 5' 8.5" (1.74 m)   Weight 184 lb 8 oz (83.689 kg)   Waist  Circumference 40 inches   Hip Circumference 40 inches   Waist to Hip Ratio 1 %   BMI (Calculated) 27.7       Nutrition Therapy Plan and Nutrition Goals:     Nutrition Therapy & Goals - 06/13/16 1742    Intervention Plan   Intervention --  Appt for RD meeting done 6/21      Nutrition Discharge: Rate Your Plate Scores:   Nutrition Goals Re-Evaluation:   Psychosocial: Target Goals: Acknowledge presence or absence of depression, maximize coping skills, provide positive support system. Participant is able to verbalize types and ability to use techniques and skills needed for reducing stress and depression.  Initial Review & Psychosocial Screening:  Initial Psych Review & Screening - 06/11/16 1321    Initial Review   Current issues with --  Vikram denies depression or history of depression.     Family Dynamics   Good Support System? Yes  Carly states his wife Larene Beach is very supportive.  They have 2 children and no grandchildren.     Barriers   Psychosocial barriers to participate in program There are no identifiable barriers or psychosocial needs.   Screening Interventions   Interventions Encouraged to exercise;Program counselor consult      Quality of Life Scores:     Quality of Life - 06/11/16 1323    Quality of Life Scores   Health/Function Pre 21.2 %   Socioeconomic Pre 26.25 %   Psych/Spiritual Pre 30 %   Family Pre 27.6 %   GLOBAL Pre 25.03 %      PHQ-9:     Recent Review Flowsheet Data    Depression screen [facility] 2/9 06/11/2016   Decreased Interest 0   Down, Depressed, Hopeless 0   PHQ - 2 Score 0   Altered sleeping 0   Tired, decreased energy 1   Change in appetite 0   Feeling bad or failure about yourself  0   Trouble concentrating 0   Moving slowly or fidgety/restless 1   Suicidal thoughts 0   PHQ-9 Score 2   Difficult doing work/chores Somewhat difficult      Psychosocial Evaluation and Intervention:     Psychosocial Evaluation -  06/13/16 1719    Psychosocial Evaluation & Interventions   Interventions Encouraged to exercise with the program and follow exercise prescription;Stress management education;Relaxation education   Comments Counselor met with Mr. Creason today for initial psychosocial evaluation.  He is 61 years old today and is in this program subsequent to a stent insertion in April.  He has a strong support system with a spouse of 15 years and a set of twin daughters.  He also is involved in his local church at times.  Mr. Umstead has a bad knee which has prohibited working out consistently in the past.  He states that he is sleeping better now and his appetite has improved for healthier options.  Mr. Amezcua denies a history of depression or anxiety, but reported since taking some of the newly prescribed medications he has noticed he is more easily irritated and angry.  He states that one of the medications causes a dull headache which contributes to his current mood.  Mr. Rackley states he has spoken to his doctor about this without much success.  He goes back to work on 7/8 and states his job can be stressful at times.  Mr. Ginsberg was very short of breath today but was committed to the exercise program.   Counselor will follow with him throughout his time in Cardiac Rehab.   Continued Psychosocial Services Needed Yes  Mr. Privitera will benefit from the psychoeducational components of this program - especially stress management and relaxation.       Psychosocial Re-Evaluation:   Vocational Rehabilitation: Provide vocational rehab assistance to qualifying candidates.   Vocational Rehab Evaluation & Intervention:     Vocational Rehab - 06/11/16 1313    Initial Vocational Rehab Evaluation & Intervention   Assessment shows need for Vocational Rehabilitation No      Education: Education Goals: Education classes will be provided on a weekly basis, covering required topics. Participant will state  understanding/return demonstration of topics presented.  Learning Barriers/Preferences:  Learning Barriers/Preferences - 06/11/16 1309    Learning Barriers/Preferences   Learning Barriers Sight;Exercise Concerns  Patient reports vision has changed since MI.  Seeing Ophthalmologist on June 15th, 2017.     Learning Preferences None      Education Topics: General Nutrition Guidelines/Fats and Fiber: -Group instruction provided by verbal, written material, models and posters to present the general guidelines for heart healthy nutrition. Gives an explanation and review of dietary fats and fiber.   Controlling Sodium/Reading Food Labels: -Group verbal and written material supporting the discussion of sodium use in heart healthy nutrition. Review and explanation with models, verbal and written materials for utilization of the food label.   Exercise Physiology & Risk Factors: - Group verbal and written instruction with models to review the exercise physiology of the cardiovascular system and associated critical values. Details cardiovascular disease risk factors and the goals associated with each risk factor.          Cardiac Rehab from 06/13/2016 in [facility] Cardiac and Pulmonary Rehab   Date  06/13/16   Educator  AS   Instruction Review Code  2- meets goals/outcomes      Aerobic Exercise & Resistance Training: - Gives group verbal and written discussion on the health impact of inactivity. On the components of aerobic and resistive training programs and the benefits of this training and how to safely progress through these programs.   Flexibility, Balance, General Exercise Guidelines: - Provides group verbal and written instruction on the benefits of flexibility and balance training programs. Provides general exercise guidelines with specific guidelines to those with heart or lung disease. Demonstration and skill practice provided.   Stress Management: - Provides group verbal and  written instruction about the health risks of elevated stress, cause of high stress, and healthy ways to reduce stress.   Depression: - Provides group verbal and written instruction on the correlation between heart/lung disease and depressed mood, treatment options, and the stigmas associated with seeking treatment.   Anatomy & Physiology of the Heart: - Group verbal and written instruction and models provide basic cardiac anatomy and physiology, with the coronary electrical and arterial systems. Review of: AMI, Angina, Valve disease, Heart Failure, Cardiac Arrhythmia, Pacemakers, and the ICD.   Cardiac Procedures: - Group verbal and written instruction and models to describe the testing methods done to diagnose heart disease. Reviews the outcomes of the test results. Describes the treatment choices: Medical Management, Angioplasty, or Coronary Bypass Surgery.   Cardiac Medications: - Group verbal and written instruction to review commonly prescribed medications for heart disease. Reviews the medication, class of the drug, and side effects. Includes the steps to properly store meds and maintain the prescription regimen.   Go Sex-Intimacy & Heart Disease, Get SMART - Goal Setting: - Group verbal and written instruction through game format to discuss heart disease and the return to sexual intimacy. Provides group verbal and written material to discuss and apply goal setting through the application of the S.M.A.R.T. Method.   Other Matters of the Heart: - Provides group verbal, written materials and models to describe Heart Failure, Angina, Valve Disease, and Diabetes in the realm of heart disease. Includes description of the disease process and treatment options available to the cardiac patient.   Exercise & Equipment Safety: - Individual verbal instruction and demonstration of equipment use and safety with use of the equipment.      Cardiac Rehab from 06/11/2016 in [facility] Cardiac and  Pulmonary Rehab   Date  06/11/16  Educator  D.W.   Instruction Review Code  1- partially meets, needs review/practice      Infection Prevention: - Provides verbal and written material to individual with discussion of infection control including proper hand washing and proper equipment cleaning during exercise session.      Cardiac Rehab from 06/11/2016 in [facility] Cardiac and Pulmonary Rehab   Date  06/11/16   Educator  DW   Instruction Review Code  2- meets goals/outcomes      Falls Prevention: - Provides verbal and written material to individual with discussion of falls prevention and safety.      Cardiac Rehab from 06/11/2016 in [facility] Cardiac and Pulmonary Rehab   Date  06/11/16   Educator  DW   Instruction Review Code  2- meets goals/outcomes      Diabetes: - Individual verbal and written instruction to review signs/symptoms of diabetes, desired ranges of glucose level fasting, after meals and with exercise. Advice that pre and post exercise glucose checks will be done for 3 sessions at entry of program.    Knowledge Questionnaire Score:     Knowledge Questionnaire Score - 06/11/16 1313    Knowledge Questionnaire Score   Pre Score 21/28      Core Components/Risk Factors/Patient Goals at Admission:     Personal Goals and Risk Factors at Admission - 06/11/16 1316    Core Components/Risk Factors/Patient Goals on Admission    Weight Management Yes;Weight Maintenance   Intervention Weight Management: Provide education and appropriate resources to help participant work on and attain dietary goals.;Weight Management: Develop a combined nutrition and exercise program designed to reach desired caloric intake, while maintaining appropriate intake of nutrient and fiber, sodium and fats, and appropriate energy expenditure required for the weight goal.   Admit Weight 184 lb 8 oz (83.689 kg)   Goal Weight: Short Term 184 lb 8 oz (83.689 kg)   Goal Weight: Long Term 184 lb 8 oz (83.689  kg)   Expected Outcomes Weight Maintenance: Understanding of the daily nutrition guidelines, which includes 25-35% calories from fat, 7% or less cal from saturated fats, less than 237m cholesterol, less than 1.5gm of sodium, & 5 or more servings of fruits and vegetables daily   Sedentary Yes   Intervention Provide advice, education, support and counseling about physical activity/exercise needs.;Develop an individualized exercise prescription for aerobic and resistive training based on initial evaluation findings, risk stratification, comorbidities and participant's personal goals.   Expected Outcomes Achievement of increased cardiorespiratory fitness and enhanced flexibility, muscular endurance and strength shown through measurements of functional capacity and personal statement of participant.   Increase Strength and Stamina Yes   Intervention Provide advice, education, support and counseling about physical activity/exercise needs.;Develop an individualized exercise prescription for aerobic and resistive training based on initial evaluation findings, risk stratification, comorbidities and participant's personal goals.   Expected Outcomes Achievement of increased cardiorespiratory fitness and enhanced flexibility, muscular endurance and strength shown through measurements of functional capacity and personal statement of participant.   Hypertension Yes   Intervention Provide education on lifestyle modifcations including regular physical activity/exercise, weight management, moderate sodium restriction and increased consumption of fresh fruit, vegetables, and low fat dairy, alcohol moderation, and smoking cessation.;Monitor prescription use compliance.   Expected Outcomes Short Term: Continued assessment and intervention until BP is < 140/959mHG in hypertensive participants. < 130/8010mG in hypertensive participants with diabetes, heart failure or chronic kidney disease.;Long Term: Maintenance of blood  pressure at goal levels.   Lipids Yes  Intervention Provide education and support for participant on nutrition & aerobic/resistive exercise along with prescribed medications to achieve LDL <44m, HDL >432m   Expected Outcomes Short Term: Participant states understanding of desired cholesterol values and is compliant with medications prescribed. Participant is following exercise prescription and nutrition guidelines.;Long Term: Cholesterol controlled with medications as prescribed, with individualized exercise RX and with personalized nutrition plan. Value goals: LDL < 7039mHDL > 40 mg.      Core Components/Risk Factors/Patient Goals Review:    Core Components/Risk Factors/Patient Goals at Discharge (Final Review):    ITP Comments:     ITP Comments      06/20/16 0927           ITP Comments 30 day review.  Continue with ITP  New to program          Comments:

## 2016-06-20 NOTE — Progress Notes (Signed)
Daily Session Note  Patient Details  Name: Jesse Soto MRN: [id number] Date of Birth: 14-Nov-1955 Referring Provider:        Cardiac Rehab from 06/11/2016 in [facility] Cardiac and Pulmonary Rehab   Referring Provider  Kathlyn Sacramento MD      Encounter Date: 06/20/2016  Check In:     Session Check In - 06/20/16 1626    Check-In   Location [facility]-Cardiac & Pulmonary Rehab   Staff Present Roanna Epley, RN, Vickki Hearing, BA, ACSM CEP, Exercise Physiologist;Kaysin Brock Brayton El, DPT, CEEA   Supervising physician immediately available to respond to emergencies See telemetry face sheet for immediately available ER MD   Medication changes reported     No   Fall or balance concerns reported    No   Warm-up and Cool-down Performed on first and last piece of equipment   Resistance Training Performed Yes   VAD Patient? No   Pain Assessment   Currently in Pain? No/denies   Multiple Pain Sites No         Goals Met:  Independence with exercise equipment Exercise tolerated well No report of cardiac concerns or symptoms  Goals Unmet:  Not Applicable  Comments: Patient completed exercise prescription and all exercise goals during rehab session. The exercise was tolerated well and the patient is progressing in the program.    Dr. Emily Filbert is Medical Director for [place] and [facility].

## 2016-06-21 ENCOUNTER — Encounter: Payer: Managed Care, Other (non HMO) | Admitting: *Deleted

## 2016-06-21 ENCOUNTER — Telehealth: Payer: Self-pay | Admitting: Cardiovascular Disease

## 2016-06-21 DIAGNOSIS — Z9861 Coronary angioplasty status: Secondary | ICD-10-CM

## 2016-06-21 DIAGNOSIS — I214 Non-ST elevation (NSTEMI) myocardial infarction: Secondary | ICD-10-CM | POA: Diagnosis not present

## 2016-06-21 NOTE — Progress Notes (Signed)
Daily Session Note  Patient Details  Name: Jesse Soto MRN: [id number] Date of Birth: 08/30/1955 Referring Provider:        Cardiac Rehab from 06/11/2016 in [facility] Cardiac and Pulmonary Rehab   Referring Provider  Kathlyn Sacramento MD      Encounter Date: 06/21/2016  Check In:     Session Check In - 06/21/16 1709    Check-In   Location [facility]-Cardiac & Pulmonary Rehab   Staff Present Gerlene Burdock, RN, Vickki Hearing, BA, ACSM CEP, Exercise Physiologist;Diane Joya Gaskins, RN, BSN   Supervising physician immediately available to respond to emergencies See telemetry face sheet for immediately available ER MD   Medication changes reported     No   Fall or balance concerns reported    No   Warm-up and Cool-down Performed on first and last piece of equipment   Resistance Training Performed Yes   VAD Patient? No   Pain Assessment   Currently in Pain? No/denies         Goals Met:  Independence with exercise equipment Exercise tolerated well No report of cardiac concerns or symptoms Strength training completed today  Goals Unmet:  Not Applicable  Comments:  Pt able to follow exercise prescription today without complaint.  Will continue to monitor for progression.    Dr. Emily Filbert is Medical Director for [place] and [facility].

## 2016-06-21 NOTE — Telephone Encounter (Signed)
FYI

## 2016-06-21 NOTE — Telephone Encounter (Signed)
Per rehab Jesse Soto has stopped taking Imdur bc it gives him a headache.  Patient says he has been fine since he stopped.   Per Nurse Diane he was able to walk on treadmill without problems or chest pain.  Finished entire rehab session yesterday .  Has had no difficulties.

## 2016-06-22 NOTE — Telephone Encounter (Signed)
Fine to stop Imdur.  

## 2016-06-25 ENCOUNTER — Encounter: Payer: Managed Care, Other (non HMO) | Admitting: *Deleted

## 2016-06-25 DIAGNOSIS — Z9861 Coronary angioplasty status: Secondary | ICD-10-CM

## 2016-06-25 DIAGNOSIS — I214 Non-ST elevation (NSTEMI) myocardial infarction: Secondary | ICD-10-CM

## 2016-06-25 NOTE — Progress Notes (Signed)
Incomplete Session Note  Patient Details  Name: Jesse Soto MRN: [id number] Date of Birth: July 16, 1955 Referring Provider:        [facility]   Referring Provider  Lorine BearsArida, Muhammad MD      Jesse Soto did not complete his rehab session.  Jesse Soto presented to Cardiac Rehab today at 4 p.m. As scheduled.  He stated he did not think he was going to be able to exercise, as he was having severe left groin pain.  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  He stated he took him over 15 minutes to walk from his car to get to the rehab gym.  Nurse agreed that he would not be able to exercise.  Nurse instructed him to get evaluated by MD prior to returning to cardiac rehab.  Nurse asked patient if he needed assistance getting to his car.  He refused offer of assistance in getting to his car.

## 2016-06-27 ENCOUNTER — Encounter: Payer: Managed Care, Other (non HMO) | Admitting: *Deleted

## 2016-06-27 DIAGNOSIS — I214 Non-ST elevation (NSTEMI) myocardial infarction: Secondary | ICD-10-CM

## 2016-06-27 DIAGNOSIS — Z9861 Coronary angioplasty status: Secondary | ICD-10-CM

## 2016-06-27 NOTE — Progress Notes (Signed)
Daily Session Note  Patient Details  Name: Jesse Soto MRN: [id number] Date of Birth: 07/22/1955 Referring Provider:        Cardiac Rehab from 06/11/2016 in [facility]   Referring Provider  Arida, Muhammad MD      Encounter Date: 06/27/2016  Check In:     Session Check In - 06/27/16 1638    Check-In   Location [facility]   Staff Present Susanne Bice, RN, BSN, CCRP;Amanda Sommer, BA, ACSM CEP, Exercise Physiologist;Diane Wright, RN, BSN   Supervising physician immediately available to respond to emergencies See telemetry face sheet for immediately available ER MD   Medication changes reported     No   Fall or balance concerns reported    No   Warm-up and Cool-down Performed on first and last piece of equipment   Resistance Training Performed Yes   VAD Patient? No   Pain Assessment   Currently in Pain? No/denies         Goals Met:  Independence with exercise equipment Exercise tolerated well No report of cardiac concerns or symptoms Strength training completed today  Goals Unmet:  Not Applicable  Comments:  Pt able to follow exercise prescription today without complaint.  Will continue to monitor for progression.    Dr. Mark Miller is Medical Director for [facility] and [facility]. 

## 2016-06-28 DIAGNOSIS — I214 Non-ST elevation (NSTEMI) myocardial infarction: Secondary | ICD-10-CM

## 2016-06-28 DIAGNOSIS — Z9861 Coronary angioplasty status: Secondary | ICD-10-CM

## 2016-06-28 NOTE — Progress Notes (Signed)
Daily Session Note  Patient Details  Name: Jesse Soto MRN: [id number] Date of Birth: 03-06-1955 Referring Provider:        Cardiac Rehab from 06/11/2016 in [facility]   Referring Provider  Kathlyn Sacramento MD      Encounter Date: 06/28/2016  Check In:     Session Check In - 06/28/16 1655    Check-In   Location [facility]   Staff Present Heath Lark, RN, BSN, CCRP;Elizabth Palka, DPT, Burlene Arnt, BA, ACSM CEP, Exercise Physiologist   Supervising physician immediately available to respond to emergencies See telemetry face sheet for immediately available ER MD   Medication changes reported     No   Fall or balance concerns reported    No   Warm-up and Cool-down Performed on first and last piece of equipment   Resistance Training Performed Yes   VAD Patient? No   Pain Assessment   Currently in Pain? No/denies   Multiple Pain Sites No         Goals Met:  Independence with exercise equipment Exercise tolerated well No report of cardiac concerns or symptoms  Goals Unmet:  Not Applicable  Comments: Patient completed exercise prescription and all exercise goals during rehab session. The exercise was tolerated well and the patient is progressing in the program.    Dr. Emily Filbert is Medical Director for [facility] and [facility].

## 2016-07-02 ENCOUNTER — Encounter: Payer: Managed Care, Other (non HMO) | Attending: Cardiovascular Disease | Admitting: *Deleted

## 2016-07-02 DIAGNOSIS — Z9861 Coronary angioplasty status: Secondary | ICD-10-CM

## 2016-07-02 DIAGNOSIS — I214 Non-ST elevation (NSTEMI) myocardial infarction: Secondary | ICD-10-CM

## 2016-07-02 NOTE — Progress Notes (Signed)
Daily Session Note  Patient Details  Name: Jesse Soto MRN: [id number] Date of Birth: 02/27/1955 Referring Provider:        Cardiac Rehab from 06/11/2016 in [facility]   Referring Provider  Kathlyn Sacramento MD      Encounter Date: 07/02/2016  Check In:     Session Check In - 07/02/16 1700    Check-In   Location [facility]   Staff Present Earlean Shawl, BS, ACSM CEP, Exercise Physiologist;Amanda Oletta Darter, BA, ACSM CEP, Exercise Physiologist;Diane Joya Gaskins, RN, BSN   Supervising physician immediately available to respond to emergencies See telemetry face sheet for immediately available ER MD   Medication changes reported     No   Fall or balance concerns reported    No   Warm-up and Cool-down Performed on first and last piece of equipment   Resistance Training Performed Yes   VAD Patient? No   Pain Assessment   Currently in Pain? No/denies   Multiple Pain Sites No         Goals Met:  Proper associated with RPD/PD & O2 Sat Independence with exercise equipment Exercise tolerated well No report of cardiac concerns or symptoms Strength training completed today  Goals Unmet:  Not Applicable  Comments: Patient completed exercise prescription and all exercise goals during rehab session. The exercise was tolerated well and the patient is progressing in the program.     Dr. Emily Filbert is Medical Director for [facility] and [facility].

## 2016-07-05 ENCOUNTER — Ambulatory Visit (INDEPENDENT_AMBULATORY_CARE_PROVIDER_SITE_OTHER): Payer: Managed Care, Other (non HMO) | Admitting: Cardiovascular Disease

## 2016-07-05 ENCOUNTER — Encounter: Payer: Self-pay | Admitting: Cardiovascular Disease

## 2016-07-05 VITALS — BP 131/79 | HR 74 | Ht 69.0 in | Wt 185.2 lb

## 2016-07-05 DIAGNOSIS — I25119 Atherosclerotic heart disease of native coronary artery with unspecified angina pectoris: Secondary | ICD-10-CM

## 2016-07-05 NOTE — Patient Instructions (Signed)
Medication Instructions:  Your physician recommends that you continue on your current medications as directed. Please refer to the Current Medication list given to you today.   Labwork: none  Testing/Procedures: none  Follow-Up: Your physician recommends that you schedule a follow-up appointment in: three months with Dr. Arida.    Any Other Special Instructions Will Be Listed Below (If Applicable).     If you need a refill on your cardiac medications before your next appointment, please call your pharmacy.   

## 2016-07-05 NOTE — Progress Notes (Signed)
Cardiology Office Note   Date:  07/05/2016   ID:  Jesse Soto, DOB 01-Feb-1955, MRN [id number]  PCP:  No PCP Per Patient  Cardiologist:   Lorine BearsMuhammad Arida, MD   Chief Complaint  Patient presents with  . other    1 month f/u c/o short fused , d/c isosorbide due to headache and work note. Meds reviewed verbally with pt.      History of Present Illness: Jesse AreolaRicky D Hires is a 61 y.o. male who presents for  a follow-up visit regarding coronary artery disease.  He has known history of hyperlipidemia, psoriasis and tobacco use. He presented in April 2017 with non-ST elevation myocardial infarction.  Cardiac catheterization showed 95% proximal RCA stenosis with very large thrombus, moderate mid RCA stenosis, occluded distal left circumflex supplying OM 3 territory with collaterals, occluded second diagonal and moderate mid LAD stenosis. EF was 45%. I performed successful aspiration thrombectomy and drug-eluting stent placement to the proximal right coronary artery. This was complicated by distal embolization into PL 1 and the patient was placed on Aggrastat infusion overnight. Echocardiogram after stent placement showed normal LV systolic function with no significant valvular abnormalities. He has recurrent chest pain as an outpatient . He underwent a treadmill nuclear stress test. The study was very suboptimal due to intense GI uptake but was suggestive of LAD ischemia. Thus, I proceeded with cardiac catheterization which showed patent RCA stent with unchanged disease. FFR interrogation was performed on the LAD and right coronary artery and both of them were nonsignificant. He was noted to have PVCs during the catheterization. A follow-up Holter monitor showed only mild amount of PVCs. He was treated medically and was referred to cardiac rehabilitation. He was placed on long-acting nitroglycerin but he did not tolerate it due to headache. Overall he reports significant improvement in anginal  symptoms with cardiac rehabilitation. He resumed his yard work and he hasn't had much anginal symptoms.   Past Medical History  Diagnosis Date  . HLD (hyperlipidemia)   . Tobacco abuse   . Low back pain   . Psoriasis   . Abdominal hernia   . Coronary artery disease 03/2016     non-ST elevation myocardial infarction. Cardiac catheterization showed 95% proximal RCA stenosis with very large thrombus, moderate mid RCA stenosis, occluded distal left circumflex supplying OM 3 territory with collaterals and moderate mid LAD stenosis. EF was 45%. Successful aspiration thrombectomy and drug-eluting stent placement to the proximal right coronary artery     Past Surgical History  Procedure Laterality Date  . Hernia repair    . Cholecystectomy    . Knee surgery    . Peripheral vascular catheterization  04/02/2016    Procedure: Coronary Angiogram ;  Surgeon: Iran OuchMuhammad A Arida, MD;  Location: [facility];  Service: Cardiovascular;;  . Cardiac catheterization N/A 04/02/2016    Procedure: Left Heart Cath;  Surgeon: Iran OuchMuhammad A Arida, MD;  Location: [facility];  Service: Cardiovascular;  Laterality: N/A;  . Cardiac catheterization N/A 04/02/2016    Procedure: Coronary Stent Intervention;  Surgeon: Iran OuchMuhammad A Arida, MD;  Location: [facility];  Service: Cardiovascular;  Laterality: N/A;  . Cardiac catheterization Left 05/17/2016    Procedure: Left Heart Cath and Coronary Angiography;  Surgeon: Iran OuchMuhammad A Arida, MD;  Location: [facility];  Service: Cardiovascular;  Laterality: Left;  . Cardiac catheterization N/A 05/17/2016    Procedure: Intravascular Pressure Wire/FFR Study;  Surgeon: Iran OuchMuhammad A Arida, MD;  Location: [facility];  Service: Cardiovascular;  Laterality: N/A;     Current Outpatient Prescriptions  Medication Sig Dispense Refill  . aspirin 81 MG chewable tablet Chew 1 tablet (81 mg total) by mouth daily. 30 tablet 0  . atorvastatin (LIPITOR) 40 MG  tablet Take 1 tablet (40 mg total) by mouth daily at 6 PM. 90 tablet 1  . metoprolol tartrate (LOPRESSOR) 25 MG tablet Take 1 tablet (25 mg total) by mouth 2 (two) times daily. 180 tablet 1  . nitroGLYCERIN (NITROSTAT) 0.4 MG SL tablet Place 1 tablet (0.4 mg total) under the tongue every 5 (five) minutes as needed for chest pain. 25 tablet 3  . pantoprazole (PROTONIX) 40 MG tablet Take 1 tablet (40 mg total) by mouth daily. 90 tablet 1  . ticagrelor (BRILINTA) 90 MG TABS tablet Take 1 tablet (90 mg total) by mouth 2 (two) times daily. 180 tablet 1  . traMADol (ULTRAM) 50 MG tablet Take 50 mg by mouth daily.      No current facility-administered medications for this visit.    Allergies:   Fluticasone    Social History:  The patient  reports that he quit smoking about 3 months ago. His smoking use included Cigarettes. He has a 30 pack-year smoking history. He has never used smokeless tobacco. He reports that he drinks alcohol. He reports that he does not use illicit drugs.   Family History:  The patient's Family history is remarkable for hypertension   ROS:  Please see the history of present illness.   Otherwise, review of systems are positive for none.   All other systems are reviewed and negative.    PHYSICAL EXAM: VS:  BP 131/79 mmHg  Pulse 74  Ht  (1.753 m)  Wt 185 lb 4 oz (84.029 kg)  BMI 27.34 kg/m2 , BMI Body mass index is 27.34 kg/(m^2). GEN: Well nourished, well developed, in no acute distress HEENT: normal Neck: no JVD, carotid bruits, or masses Cardiac: RRR; no murmurs, rubs, or gallops,no edema  Respiratory:  clear to auscultation bilaterally, normal work of breathing GI: soft, nontender, nondistended, + BS MS: no deformity or atrophy Skin: warm and dry, no rash Neuro:  Strength and sensation are intact Psych: euthymic mood, full affect Right radial pulse is normal with no hematoma  EKG:  EKG is not ordered today.    Recent Labs: 04/01/2016: Magnesium  2.0 05/10/2016: ALT 13 05/16/2016: BUN 14; Creatinine, Ser 1.04; Hemoglobin 15.0; Platelets 209; Potassium 3.7; Sodium 137    Lipid Panel    Component Value Date/Time   CHOL 132 05/10/2016 0835   CHOL 199 04/01/2016 1649   TRIG 108 05/10/2016 0835   HDL 33* 05/10/2016 0835   HDL 31* 04/01/2016 1649   CHOLHDL 4.0 05/10/2016 0835   CHOLHDL 6.4 04/01/2016 1649   VLDL 15 04/01/2016 1649   LDLCALC 77 05/10/2016 0835   LDLCALC 153* 04/01/2016 1649      Wt Readings from Last 3 Encounters:  07/05/16 185 lb 4 oz (84.029 kg)  06/11/16 184 lb 8 oz (83.689 kg)  06/07/16 182 lb 1.9 oz (82.609 kg)        ASSESSMENT AND PLAN:  1.  Coronary artery disease involving native coronary arteries with other forms of angina:   Gradual improvement in symptoms especially after attending cardiac rehabilitation. His angina improved significantly and he does not feel limited at the present time. He can resume work next week on Monday without restrictions.  2.  PVCs: Holter monitor showed only mild PVCs. Continue metoprolol.  2. Hyperlipidemia:  Myalgia improved after decreasing atorvastatin 40 mg daily. Most recent LDL was 77.  3. Tobacco use: The patient's quit smoking since his cardiac event.    Disposition:   FU with me in 3 months  Signed,  Lorine BearsMuhammad Arida, MD  07/05/2016 9:58 AM    [facility]

## 2016-07-18 ENCOUNTER — Encounter: Payer: Self-pay | Admitting: *Deleted

## 2016-07-18 DIAGNOSIS — Z9861 Coronary angioplasty status: Secondary | ICD-10-CM

## 2016-07-18 DIAGNOSIS — I214 Non-ST elevation (NSTEMI) myocardial infarction: Secondary | ICD-10-CM

## 2016-07-18 NOTE — Progress Notes (Signed)
Cardiac Individual Treatment Plan  Patient Details  Name: Jesse Soto MRN: [id number] Date of Birth: 05-Nov-1955 Referring Provider:        Cardiac Rehab from 06/11/2016 in [facility] Cardiac and Pulmonary Rehab   Referring Provider  Kathlyn Sacramento MD      Initial Encounter Date:       Cardiac Rehab from 06/11/2016 in [facility] Cardiac and Pulmonary Rehab   Date  06/11/16   Referring Provider  Kathlyn Sacramento MD      Visit Diagnosis: NSTEMI (non-ST elevated myocardial infarction) ([place])  S/P PTCA (percutaneous transluminal coronary angioplasty)  Patient's Home Medications on Admission:  Current outpatient prescriptions:  .  aspirin 81 MG chewable tablet, Chew 1 tablet (81 mg total) by mouth daily., Disp: 30 tablet, Rfl: 0 .  atorvastatin (LIPITOR) 40 MG tablet, Take 1 tablet (40 mg total) by mouth daily at 6 PM., Disp: 90 tablet, Rfl: 1 .  metoprolol tartrate (LOPRESSOR) 25 MG tablet, Take 1 tablet (25 mg total) by mouth 2 (two) times daily., Disp: 180 tablet, Rfl: 1 .  nitroGLYCERIN (NITROSTAT) 0.4 MG SL tablet, Place 1 tablet (0.4 mg total) under the tongue every 5 (five) minutes as needed for chest pain., Disp: 25 tablet, Rfl: 3 .  pantoprazole (PROTONIX) 40 MG tablet, Take 1 tablet (40 mg total) by mouth daily., Disp: 90 tablet, Rfl: 1 .  ticagrelor (BRILINTA) 90 MG TABS tablet, Take 1 tablet (90 mg total) by mouth 2 (two) times daily., Disp: 180 tablet, Rfl: 1 .  traMADol (ULTRAM) 50 MG tablet, Take 50 mg by mouth daily. , Disp: , Rfl:   Past Medical History: Past Medical History  Diagnosis Date  . HLD (hyperlipidemia)   . Tobacco abuse   . Low back pain   . Psoriasis   . Abdominal hernia   . Coronary artery disease 03/2016     non-ST elevation myocardial infarction. Cardiac catheterization showed 95% proximal RCA stenosis with very large thrombus, moderate mid RCA stenosis, occluded distal left circumflex supplying OM 3 territory with collaterals and moderate mid LAD stenosis. EF  was 45%. Successful aspiration thrombectomy and drug-eluting stent placement to the proximal right coronary artery     Tobacco Use: History  Smoking status  . Former Smoker -- 1.00 packs/day for 30 years  . Types: Cigarettes  . Quit date: 04/01/2016  Smokeless tobacco  . Never Used    Labs: Recent Review Flowsheet Data    Labs for ITP Cardiac and Pulmonary Rehab Latest Ref Rng 04/01/2016 05/10/2016   Cholestrol 100 - 199 mg/dL 199 132   LDLCALC 0 - 99 mg/dL 153(H) 77   HDL >39 mg/dL 31(L) 33(L)   Trlycerides 0 - 149 mg/dL 76 108   Hemoglobin A1c 4.0 - 6.0 % 5.7 -       Exercise Target Goals:    Exercise Program Goal: Individual exercise prescription set with THRR, safety & activity barriers. Participant demonstrates ability to understand and report RPE using BORG scale, to self-measure pulse accurately, and to acknowledge the importance of the exercise prescription.  Exercise Prescription Goal: Starting with aerobic activity 30 plus minutes a day, 3 days per week for initial exercise prescription. Provide home exercise prescription and guidelines that participant acknowledges understanding prior to discharge.  Activity Barriers & Risk Stratification:     Activity Barriers & Cardiac Risk Stratification - 06/11/16 1306    Activity Barriers & Cardiac Risk Stratification   Activity Barriers --  Patient reports having had right knee  cartilage removed while in the [employer].  States right knee "floats" requiring him to wear a brace.  As a result, this affects his lower back - two vertebrae are irritated due to his right knee problems.        6 Minute Walk:     6 Minute Walk      06/11/16 1249       6 Minute Walk   Phase Initial     Distance 1300 feet     Walk Time 6 minutes     # of Rest Breaks 0     MPH 2.46     METS 3.65     RPE 13     Perceived Dyspnea  2     VO2 Peak 12.77     Symptoms No     Resting HR 47 bpm     Resting BP 122/70 mmHg     Max Ex. HR 113 bpm      Max Ex. BP 132/68 mmHg        Initial Exercise Prescription:     Initial Exercise Prescription - 06/11/16 1200    Date of Initial Exercise RX and Referring Provider   Date 06/11/16   Referring Provider Kathlyn Sacramento MD   Treadmill   MPH 2.2   Grade 1   Minutes 15   METs 2.99   NuStep   Level 3   Watts 30   Minutes 15   METs 2   Recumbant Elliptical   Level 3   RPM 50   Watts 30   Minutes 15   METs 2   REL-XR   Level 2   Watts 30   Minutes 15   METs 2   T5 Nustep   Level 2   Watts 30   Minutes 15   METs 2   Biostep-RELP   Level 3   Watts 30   Minutes 15   METs 2   Prescription Details   Frequency (times per week) 3   Duration Progress to 30 minutes of continuous aerobic without signs/symptoms of physical distress   Intensity   THRR 40-80% of Max Heartrate 93-138   Ratings of Perceived Exertion 11-13   Perceived Dyspnea 0-4   Progression   Progression Continue to progress workloads to maintain intensity without signs/symptoms of physical distress.   Resistance Training   Training Prescription Yes   Weight 2   Reps 10-12      Perform Capillary Blood Glucose checks as needed.  Exercise Prescription Changes:     Exercise Prescription Changes      06/15/16 1000 06/29/16 1000         Exercise Review   Progression Yes Yes      Response to Exercise   Blood Pressure (Admit) 134/80 mmHg 124/64 mmHg      Blood Pressure (Exercise) 148/72 mmHg 138/68 mmHg      Blood Pressure (Exit) 134/68 mmHg 110/60 mmHg      Heart Rate (Admit) 66 bpm 84 bpm      Heart Rate (Exercise) 81 bpm 109 bpm      Heart Rate (Exit) 63 bpm 66 bpm      Rating of Perceived Exertion (Exercise) 15 13      Resistance Training   Training Prescription Yes Yes      Weight 2 3      Reps 10-12 10-15      Treadmill   MPH 1.9 2.5      Grade  0 0      Minutes 8 20      METs 2.45 2.9      NuStep   Level 4       Watts 24       Minutes 20       METs 2       REL-XR   Level 1        Watts 69       Minutes 10       METs 3.8          Exercise Comments:     Exercise Comments      06/11/16 1255 06/13/16 1743 06/15/16 1040 07/12/16 1359     Exercise Comments Wears knee brace for extra support.  He would like to increase his stamina while here in program. First full day of exercise!  Patient was oriented to gym and equipment including functions, settings, policies, and procedures.  Patient's individual exercise prescription and treatment plan were reviewed.  All starting workloads were established based on the results of the 6 minute walk test done at initial orientation visit.  The plan for exercise progression was also introduced and progression will be customized based on patient's performance and goals. Dacotah has completed 2 days of exercise and has tolerated sessions well. Jerett's last session was 7/3 and no new changes have been made to his exercise program.       Discharge Exercise Prescription (Final Exercise Prescription Changes):     Exercise Prescription Changes - 06/29/16 1000    Exercise Review   Progression Yes   Response to Exercise   Blood Pressure (Admit) 124/64 mmHg   Blood Pressure (Exercise) 138/68 mmHg   Blood Pressure (Exit) 110/60 mmHg   Heart Rate (Admit) 84 bpm   Heart Rate (Exercise) 109 bpm   Heart Rate (Exit) 66 bpm   Rating of Perceived Exertion (Exercise) 13   Resistance Training   Training Prescription Yes   Weight 3   Reps 10-15   Treadmill   MPH 2.5   Grade 0   Minutes 20   METs 2.9      Nutrition:  Target Goals: Understanding of nutrition guidelines, daily intake of sodium <1533m, cholesterol <202m calories 30% from fat and 7% or less from saturated fats, daily to have 5 or more servings of fruits and vegetables.  Biometrics:     Pre Biometrics - 06/11/16 1253    Pre Biometrics   Height 5' 8.5" (1.74 m)   Weight 184 lb 8 oz (83.689 kg)   Waist Circumference 40 inches   Hip Circumference 40 inches   Waist to  Hip Ratio 1 %   BMI (Calculated) 27.7       Nutrition Therapy Plan and Nutrition Goals:     Nutrition Therapy & Goals - 06/29/16 1211    Nutrition Therapy   Diet Instructed on a heart healthy diet  based on 1900 calories and DASH diet principles.   Drug/Food Interactions Statins/Certain Fruits   Protein (specify units) 8   Fiber 30 grams   Whole Grain Foods 3 servings   Saturated Fats 13 max. grams   Fruits and Vegetables 5 servings/day   Sodium 2000 grams  150081mdeal   Personal Nutrition Goals   Personal Goal #1 Read labels for saturated fat, trans fat and sodium.   Personal Goal #2 Include a vegetable or fruit or preferably both at meals.   Personal Goal #3 Include more whole grain servings. Refer  to list.   Intervention Plan   Intervention Prescribe, educate and counsel regarding individualized specific dietary modifications aiming towards targeted core components such as weight, hypertension, lipid management, diabetes, heart failure and other comorbidities.;Nutrition handout(s) given to patient.   Expected Outcomes Short Term Goal: Understand basic principles of dietary content, such as calories, fat, sodium, cholesterol and nutrients.;Short Term Goal: A plan has been developed with personal nutrition goals set during dietitian appointment.;Long Term Goal: Adherence to prescribed nutrition plan.      Nutrition Discharge: Rate Your Plate Scores:     Nutrition Assessments - 06/29/16 1215    Rate Your Plate Scores   Pre Score 56   Pre Score % 62 %      Nutrition Goals Re-Evaluation:   Psychosocial: Target Goals: Acknowledge presence or absence of depression, maximize coping skills, provide positive support system. Participant is able to verbalize types and ability to use techniques and skills needed for reducing stress and depression.  Initial Review & Psychosocial Screening:     Initial Psych Review & Screening - 06/11/16 1321    Initial Review   Current issues  with --  Bevan denies depression or history of depression.     Family Dynamics   Good Support System? Yes  Isayah states his wife Larene Beach is very supportive.  They have 2 children and no grandchildren.     Barriers   Psychosocial barriers to participate in program There are no identifiable barriers or psychosocial needs.   Screening Interventions   Interventions Encouraged to exercise;Program counselor consult      Quality of Life Scores:     Quality of Life - 06/11/16 1323    Quality of Life Scores   Health/Function Pre 21.2 %   Socioeconomic Pre 26.25 %   Psych/Spiritual Pre 30 %   Family Pre 27.6 %   GLOBAL Pre 25.03 %      PHQ-9:     Recent Review Flowsheet Data    Depression screen [facility] 2/9 06/11/2016   Decreased Interest 0   Down, Depressed, Hopeless 0   PHQ - 2 Score 0   Altered sleeping 0   Tired, decreased energy 1   Change in appetite 0   Feeling bad or failure about yourself  0   Trouble concentrating 0   Moving slowly or fidgety/restless 1   Suicidal thoughts 0   PHQ-9 Score 2   Difficult doing work/chores Somewhat difficult      Psychosocial Evaluation and Intervention:     Psychosocial Evaluation - 06/13/16 1719    Psychosocial Evaluation & Interventions   Interventions Encouraged to exercise with the program and follow exercise prescription;Stress management education;Relaxation education   Comments Counselor met with Mr. Missildine today for initial psychosocial evaluation.  He is 61 years old today and is in this program subsequent to a stent insertion in April.  He has a strong support system with a spouse of 15 years and a set of twin daughters.  He also is involved in his local church at times.  Mr. Taillon has a bad knee which has prohibited working out consistently in the past.  He states that he is sleeping better now and his appetite has improved for healthier options.  Mr. Rufener denies a history of depression or anxiety, but reported since  taking some of the newly prescribed medications he has noticed he is more easily irritated and angry.  He states that one of the medications causes a dull headache which contributes to  his current mood.  Mr. Gorum states he has spoken to his doctor about this without much success.  He goes back to work on 7/8 and states his job can be stressful at times.  Mr. Wanke was very short of breath today but was committed to the exercise program.   Counselor will follow with him throughout his time in Cardiac Rehab.   Continued Psychosocial Services Needed Yes  Mr. Surratt will benefit from the psychoeducational components of this program - especially stress management and relaxation.       Psychosocial Re-Evaluation:     Psychosocial Re-Evaluation      06/20/16 1721           Psychosocial Re-Evaluation   Comments Counselor follow up with Mr. Bilyeu today reporting this program is "just what I needed."  He is experiencing fewer negative symptoms and already has more energy since beginning.  He was also breathing better today on the treadmill and stated his headaches are not as severe now.  Counselor commended him on his commitment to improved health and consistent exercise.            Vocational Rehabilitation: Provide vocational rehab assistance to qualifying candidates.   Vocational Rehab Evaluation & Intervention:     Vocational Rehab - 06/11/16 1313    Initial Vocational Rehab Evaluation & Intervention   Assessment shows need for Vocational Rehabilitation No      Education: Education Goals: Education classes will be provided on a weekly basis, covering required topics. Participant will state understanding/return demonstration of topics presented.  Learning Barriers/Preferences:     Learning Barriers/Preferences - 06/11/16 1309    Learning Barriers/Preferences   Learning Barriers Sight;Exercise Concerns  Patient reports vision has changed since MI.  Seeing Ophthalmologist on  June 15th, 2017.     Learning Preferences None      Education Topics: General Nutrition Guidelines/Fats and Fiber: -Group instruction provided by verbal, written material, models and posters to present the general guidelines for heart healthy nutrition. Gives an explanation and review of dietary fats and fiber.   Controlling Sodium/Reading Food Labels: -Group verbal and written material supporting the discussion of sodium use in heart healthy nutrition. Review and explanation with models, verbal and written materials for utilization of the food label.   Exercise Physiology & Risk Factors: - Group verbal and written instruction with models to review the exercise physiology of the cardiovascular system and associated critical values. Details cardiovascular disease risk factors and the goals associated with each risk factor.          Cardiac Rehab from 06/27/2016 in [facility] Cardiac and Pulmonary Rehab   Date  06/13/16   Educator  AS   Instruction Review Code  2- meets goals/outcomes      Aerobic Exercise & Resistance Training: - Gives group verbal and written discussion on the health impact of inactivity. On the components of aerobic and resistive training programs and the benefits of this training and how to safely progress through these programs.   Flexibility, Balance, General Exercise Guidelines: - Provides group verbal and written instruction on the benefits of flexibility and balance training programs. Provides general exercise guidelines with specific guidelines to those with heart or lung disease. Demonstration and skill practice provided.      Cardiac Rehab from 06/27/2016 in [facility] Cardiac and Pulmonary Rehab   Date  06/20/16   Educator  AS   Instruction Review Code  2- meets goals/outcomes      Stress Management: -  Provides group verbal and written instruction about the health risks of elevated stress, cause of high stress, and healthy ways to reduce stress.      Cardiac  Rehab from 06/27/2016 in [facility] Cardiac and Pulmonary Rehab   Date  06/27/16   Educator  Kathreen Cornfield, [facility]   Instruction Review Code  2- meets goals/outcomes      Depression: - Provides group verbal and written instruction on the correlation between heart/lung disease and depressed mood, treatment options, and the stigmas associated with seeking treatment.   Anatomy & Physiology of the Heart: - Group verbal and written instruction and models provide basic cardiac anatomy and physiology, with the coronary electrical and arterial systems. Review of: AMI, Angina, Valve disease, Heart Failure, Cardiac Arrhythmia, Pacemakers, and the ICD.   Cardiac Procedures: - Group verbal and written instruction and models to describe the testing methods done to diagnose heart disease. Reviews the outcomes of the test results. Describes the treatment choices: Medical Management, Angioplasty, or Coronary Bypass Surgery.   Cardiac Medications: - Group verbal and written instruction to review commonly prescribed medications for heart disease. Reviews the medication, class of the drug, and side effects. Includes the steps to properly store meds and maintain the prescription regimen.   Go Sex-Intimacy & Heart Disease, Get SMART - Goal Setting: - Group verbal and written instruction through game format to discuss heart disease and the return to sexual intimacy. Provides group verbal and written material to discuss and apply goal setting through the application of the S.M.A.R.T. Method.   Other Matters of the Heart: - Provides group verbal, written materials and models to describe Heart Failure, Angina, Valve Disease, and Diabetes in the realm of heart disease. Includes description of the disease process and treatment options available to the cardiac patient.   Exercise & Equipment Safety: - Individual verbal instruction and demonstration of equipment use and safety with use of the equipment.      Cardiac Rehab  from 06/11/2016 in [facility] Cardiac and Pulmonary Rehab   Date  06/11/16   Educator  D.W.   Instruction Review Code  1- partially meets, needs review/practice      Infection Prevention: - Provides verbal and written material to individual with discussion of infection control including proper hand washing and proper equipment cleaning during exercise session.      Cardiac Rehab from 06/11/2016 in [facility] Cardiac and Pulmonary Rehab   Date  06/11/16   Educator  DW   Instruction Review Code  2- meets goals/outcomes      Falls Prevention: - Provides verbal and written material to individual with discussion of falls prevention and safety.      Cardiac Rehab from 06/11/2016 in [facility] Cardiac and Pulmonary Rehab   Date  06/11/16   Educator  DW   Instruction Review Code  2- meets goals/outcomes      Diabetes: - Individual verbal and written instruction to review signs/symptoms of diabetes, desired ranges of glucose level fasting, after meals and with exercise. Advice that pre and post exercise glucose checks will be done for 3 sessions at entry of program.    Knowledge Questionnaire Score:     Knowledge Questionnaire Score - 06/11/16 1313    Knowledge Questionnaire Score   Pre Score 21/28      Core Components/Risk Factors/Patient Goals at Admission:     Personal Goals and Risk Factors at Admission - 06/11/16 1316    Core Components/Risk Factors/Patient Goals on Admission    Weight Management  Yes;Weight Maintenance   Intervention Weight Management: Provide education and appropriate resources to help participant work on and attain dietary goals.;Weight Management: Develop a combined nutrition and exercise program designed to reach desired caloric intake, while maintaining appropriate intake of nutrient and fiber, sodium and fats, and appropriate energy expenditure required for the weight goal.   Admit Weight 184 lb 8 oz (83.689 kg)   Goal Weight: Short Term 184 lb 8 oz (83.689 kg)   Goal  Weight: Long Term 184 lb 8 oz (83.689 kg)   Expected Outcomes Weight Maintenance: Understanding of the daily nutrition guidelines, which includes 25-35% calories from fat, 7% or less cal from saturated fats, less than 292m cholesterol, less than 1.5gm of sodium, & 5 or more servings of fruits and vegetables daily   Sedentary Yes   Intervention Provide advice, education, support and counseling about physical activity/exercise needs.;Develop an individualized exercise prescription for aerobic and resistive training based on initial evaluation findings, risk stratification, comorbidities and participant's personal goals.   Expected Outcomes Achievement of increased cardiorespiratory fitness and enhanced flexibility, muscular endurance and strength shown through measurements of functional capacity and personal statement of participant.   Increase Strength and Stamina Yes   Intervention Provide advice, education, support and counseling about physical activity/exercise needs.;Develop an individualized exercise prescription for aerobic and resistive training based on initial evaluation findings, risk stratification, comorbidities and participant's personal goals.   Expected Outcomes Achievement of increased cardiorespiratory fitness and enhanced flexibility, muscular endurance and strength shown through measurements of functional capacity and personal statement of participant.   Hypertension Yes   Intervention Provide education on lifestyle modifcations including regular physical activity/exercise, weight management, moderate sodium restriction and increased consumption of fresh fruit, vegetables, and low fat dairy, alcohol moderation, and smoking cessation.;Monitor prescription use compliance.   Expected Outcomes Short Term: Continued assessment and intervention until BP is < 140/973mHG in hypertensive participants. < 130/8063mG in hypertensive participants with diabetes, heart failure or chronic kidney  disease.;Long Term: Maintenance of blood pressure at goal levels.   Lipids Yes   Intervention Provide education and support for participant on nutrition & aerobic/resistive exercise along with prescribed medications to achieve LDL <78m80mDL >40mg69mExpected Outcomes Short Term: Participant states understanding of desired cholesterol values and is compliant with medications prescribed. Participant is following exercise prescription and nutrition guidelines.;Long Term: Cholesterol controlled with medications as prescribed, with individualized exercise RX and with personalized nutrition plan. Value goals: LDL < 78mg,67m > 40 mg.      Core Components/Risk Factors/Patient Goals Review:      Goals and Risk Factor Review      06/28/16 1333           Core Components/Risk Factors/Patient Goals Review   Personal Goals Review Increase Strength and Stamina       Review Mamoudou Genied he is feeling stronger since begnning Heart Track.       Expected Outcomes Marke Raheelcontinue to be consistent with exercise and see improvement in overall function.          Core Components/Risk Factors/Patient Goals at Discharge (Final Review):      Goals and Risk Factor Review - 06/28/16 1333    Core Components/Risk Factors/Patient Goals Review   Personal Goals Review Increase Strength and Stamina   Review Trevionne Granvilled he is feeling stronger since begnning Heart Track.   Expected Outcomes Mikhael Mosiahcontinue to be consistent with exercise and see improvement in overall function.  ITP Comments:     ITP Comments      06/20/16 0927 06/20/16 1830 06/20/16 1831 06/25/16 1902 07/18/16 0802   ITP Comments 30 day review.  Continue with ITP  New to program Kamoni informed staff in Cardiac Rehab today that he had stopped taking his Imdur 30 mg Blake informed staff in Cardiac Rehab today that he had stopped taking his Imdur 30 mg once a day because he was having a constant headache.  He stated he has not taken it  for the past three days.  He did not take it on Monday because he had to drive to the [place] in [place] for an appointment and he just could not drive there with a headache.  He states his SOB has improved and he has not had any chest pain.  He was able to do 5 minutes warm up on treadmill then 15 minutes on Treadmill at 2.5 mph.  Will contact Dr. Tyrell Antonio nurse to inform MD that patient has stopped taking Imdur.  No cardiac symptoms reported while exercising.   Damaris Schooner did not complete his rehab session.  Elmon presented to Cardiac Rehab today at 4 p.m. As scheduled.  He stated he did not think he was going to be able to exercise, as he was having left groin pain.  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  He stated he took him over 15 minutes to walk from his car to get to the rehab gym.  Nurse agreed that he would not be able to exercise.  Nurse instructed him to get evaluated by MD prior to returning to cardiac rehab.  Nurse asked patient if he needed assistance getting to his car.  He refused offer of assistance in getting to his car.   30 day review. Continue with ITP      Comments:

## 2016-07-26 ENCOUNTER — Telehealth: Payer: Self-pay

## 2016-07-26 NOTE — Telephone Encounter (Signed)
Jesse Soto hasnt attended since 7/3.  LMOM about returning to class. 

## 2016-07-26 NOTE — Progress Notes (Signed)
Mr Bossio hasnt attended since 7/3.  LMOM about returning to class.

## 2016-08-01 ENCOUNTER — Encounter: Payer: Managed Care, Other (non HMO) | Attending: Cardiovascular Disease

## 2016-08-01 DIAGNOSIS — I214 Non-ST elevation (NSTEMI) myocardial infarction: Secondary | ICD-10-CM | POA: Insufficient documentation

## 2016-08-09 DIAGNOSIS — I214 Non-ST elevation (NSTEMI) myocardial infarction: Secondary | ICD-10-CM

## 2016-08-09 DIAGNOSIS — Z9861 Coronary angioplasty status: Secondary | ICD-10-CM

## 2016-08-13 ENCOUNTER — Encounter: Payer: Managed Care, Other (non HMO) | Admitting: *Deleted

## 2016-08-13 DIAGNOSIS — I214 Non-ST elevation (NSTEMI) myocardial infarction: Secondary | ICD-10-CM | POA: Diagnosis present

## 2016-08-13 DIAGNOSIS — Z9861 Coronary angioplasty status: Secondary | ICD-10-CM

## 2016-08-13 NOTE — Progress Notes (Signed)
Daily Session Note  Patient Details  Name: Jesse Soto MRN: [id number] Date of Birth: 14-Sep-1955 Referring Provider:   Flowsheet Row Cardiac Rehab from 06/11/2016 in [facility]  Referring Provider  Kathlyn Sacramento MD      Encounter Date: 08/13/2016  Check In:     Session Check In - 08/13/16 1745      Check-In   Location [facility]   Staff Present Nada Maclachlan, BA, ACSM CEP, Exercise Physiologist;Khamron Gellert Amedeo Plenty, BS, ACSM CEP, Exercise Physiologist;Diane Joya Gaskins, RN, BSN   Supervising physician immediately available to respond to emergencies See telemetry face sheet for immediately available ER MD   Medication changes reported     No   Fall or balance concerns reported    No   Warm-up and Cool-down Performed on first and last piece of equipment   Resistance Training Performed No  got to class late so omitted resistance training in order to get 30 min of cardio    VAD Patient? No     Pain Assessment   Currently in Pain? No/denies   Multiple Pain Sites No         Goals Met:  Independence with exercise equipment Exercise tolerated well No report of cardiac concerns or symptoms  Goals Unmet:  Not Applicable  Comments: Pt able to follow exercise prescription today without complaint.  Will continue to monitor for progression.    Dr. Emily Filbert is Medical Director for [place] and [facility].

## 2016-08-15 ENCOUNTER — Encounter: Payer: Managed Care, Other (non HMO) | Admitting: *Deleted

## 2016-08-15 ENCOUNTER — Encounter: Payer: Self-pay | Admitting: *Deleted

## 2016-08-15 DIAGNOSIS — Z9861 Coronary angioplasty status: Secondary | ICD-10-CM

## 2016-08-15 DIAGNOSIS — I214 Non-ST elevation (NSTEMI) myocardial infarction: Secondary | ICD-10-CM

## 2016-08-15 NOTE — Progress Notes (Signed)
Cardiac Individual Treatment Plan  Patient Details  Name: Jesse Soto MRN: [id number] Date of Birth: Jan 30, 1955 Referring Provider:   Flowsheet Row Cardiac Rehab from 06/11/2016 in [facility] Cardiac and Pulmonary Rehab  Referring Provider  Kathlyn Sacramento MD      Initial Encounter Date:  Flowsheet Row Cardiac Rehab from 06/11/2016 in [facility] Cardiac and Pulmonary Rehab  Date  06/11/16  Referring Provider  Kathlyn Sacramento MD      Visit Diagnosis: NSTEMI (non-ST elevated myocardial infarction) ([place])  S/P PTCA (percutaneous transluminal coronary angioplasty)  Patient's Home Medications on Admission:  Current Outpatient Prescriptions:  .  aspirin 81 MG chewable tablet, Chew 1 tablet (81 mg total) by mouth daily., Disp: 30 tablet, Rfl: 0 .  atorvastatin (LIPITOR) 40 MG tablet, Take 1 tablet (40 mg total) by mouth daily at 6 PM., Disp: 90 tablet, Rfl: 1 .  metoprolol tartrate (LOPRESSOR) 25 MG tablet, Take 1 tablet (25 mg total) by mouth 2 (two) times daily., Disp: 180 tablet, Rfl: 1 .  nitroGLYCERIN (NITROSTAT) 0.4 MG SL tablet, Place 1 tablet (0.4 mg total) under the tongue every 5 (five) minutes as needed for chest pain., Disp: 25 tablet, Rfl: 3 .  pantoprazole (PROTONIX) 40 MG tablet, Take 1 tablet (40 mg total) by mouth daily., Disp: 90 tablet, Rfl: 1 .  ticagrelor (BRILINTA) 90 MG TABS tablet, Take 1 tablet (90 mg total) by mouth 2 (two) times daily., Disp: 180 tablet, Rfl: 1 .  traMADol (ULTRAM) 50 MG tablet, Take 50 mg by mouth daily. , Disp: , Rfl:   Past Medical History: Past Medical History:  Diagnosis Date  . Abdominal hernia   . Coronary artery disease 03/2016    non-ST elevation myocardial infarction. Cardiac catheterization showed 95% proximal RCA stenosis with very large thrombus, moderate mid RCA stenosis, occluded distal left circumflex supplying OM 3 territory with collaterals and moderate mid LAD stenosis. EF was 45%. Successful aspiration thrombectomy and drug-eluting  stent placement to the proximal right coronary artery   . HLD (hyperlipidemia)   . Low back pain   . Psoriasis   . Tobacco abuse     Tobacco Use: History  Smoking Status  . Former Smoker  . Packs/day: 1.00  . Years: 30.00  . Types: Cigarettes  . Quit date: 04/01/2016  Smokeless Tobacco  . Never Used    Labs: Recent Review Flowsheet Data    Labs for ITP Cardiac and Pulmonary Rehab Latest Ref Rng & Units 04/01/2016 05/10/2016   Cholestrol 100 - 199 mg/dL 199 132   LDLCALC 0 - 99 mg/dL 153(H) 77   HDL >39 mg/dL 31(L) 33(L)   Trlycerides 0 - 149 mg/dL 76 108   Hemoglobin A1c 4.0 - 6.0 % 5.7 -       Exercise Target Goals:    Exercise Program Goal: Individual exercise prescription set with THRR, safety & activity barriers. Participant demonstrates ability to understand and report RPE using BORG scale, to self-measure pulse accurately, and to acknowledge the importance of the exercise prescription.  Exercise Prescription Goal: Starting with aerobic activity 30 plus minutes a day, 3 days per week for initial exercise prescription. Provide home exercise prescription and guidelines that participant acknowledges understanding prior to discharge.  Activity Barriers & Risk Stratification:     Activity Barriers & Cardiac Risk Stratification - 06/11/16 1306      Activity Barriers & Cardiac Risk Stratification   Activity Barriers --  Patient reports having had right knee cartilage removed while in  the [employer].  States right knee "floats" requiring him to wear a brace.  As a result, this affects his lower back - two vertebrae are irritated due to his right knee problems.        6 Minute Walk:     6 Minute Walk    Row Name 06/11/16 1249         6 Minute Walk   Phase Initial     Distance 1300 feet     Walk Time 6 minutes     # of Rest Breaks 0     MPH 2.46     METS 3.65     RPE 13     Perceived Dyspnea  2     VO2 Peak 12.77     Symptoms No     Resting HR 47 bpm     Resting  BP 122/70     Max Ex. HR 113 bpm     Max Ex. BP 132/68        Initial Exercise Prescription:     Initial Exercise Prescription - 06/11/16 1200      Date of Initial Exercise RX and Referring Provider   Date 06/11/16   Referring Provider Kathlyn Sacramento MD     Treadmill   MPH 2.2   Grade 1   Minutes 15   METs 2.99     NuStep   Level 3   Watts 30   Minutes 15   METs 2     Recumbant Elliptical   Level 3   RPM 50   Watts 30   Minutes 15   METs 2     REL-XR   Level 2   Watts 30   Minutes 15   METs 2     T5 Nustep   Level 2   Watts 30   Minutes 15   METs 2     Biostep-RELP   Level 3   Watts 30   Minutes 15   METs 2     Prescription Details   Frequency (times per week) 3   Duration Progress to 30 minutes of continuous aerobic without signs/symptoms of physical distress     Intensity   THRR 40-80% of Max Heartrate 93-138   Ratings of Perceived Exertion 11-13   Perceived Dyspnea 0-4     Progression   Progression Continue to progress workloads to maintain intensity without signs/symptoms of physical distress.     Resistance Training   Training Prescription Yes   Weight 2   Reps 10-12      Perform Capillary Blood Glucose checks as needed.  Exercise Prescription Changes:     Exercise Prescription Changes    Row Name 06/15/16 1000 06/29/16 1000 07/26/16 1400         Exercise Review   Progression Yes Yes Yes       Response to Exercise   Blood Pressure (Admit) 134/80 124/64 104/66     Blood Pressure (Exercise) 148/72 138/68 132/68     Blood Pressure (Exit) 134/68 110/60 124/72     Heart Rate (Admit) 66 bpm 84 bpm 81 bpm     Heart Rate (Exercise) 81 bpm 109 bpm 100 bpm     Heart Rate (Exit) 63 bpm 66 bpm 72 bpm     Rating of Perceived Exertion (Exercise) _0 Symptoms  -  - none     Duration  -  - Progress to 45 minutes of aerobic exercise  without signs/symptoms of physical distress     Intensity  -  - THRR unchanged        Resistance Training   Training Prescription Yes Yes Yes     Weight _0 Reps 10-12 10-15 10-15       Treadmill   MPH 1.9 2.5  -     Grade 0 0  -     Minutes 8 20  -     METs 2.45 2.9  -       NuStep   Level 4  -  -     Watts 24  -  -     Minutes 20  -  -     METs 2  -  -       REL-XR   Level 1  -  -     Watts 69  -  -     Minutes 10  -  -     METs 3.8  -  -       T5 Nustep   Level  -  - 4     Minutes  -  - 20        Exercise Comments:     Exercise Comments    Row Name 06/11/16 1255 06/13/16 1743 06/15/16 1040 07/12/16 1359 08/09/16 1531   Exercise Comments Wears knee brace for extra support.  He would like to increase his stamina while here in program. First full day of exercise!  Patient was oriented to gym and equipment including functions, settings, policies, and procedures.  Patient's individual exercise prescription and treatment plan were reviewed.  All starting workloads were established based on the results of the 6 minute walk test done at initial orientation visit.  The plan for exercise progression was also introduced and progression will be customized based on patient's performance and goals. Jesse Soto has completed 2 days of exercise and has tolerated sessions well. Jesse Soto last session was 7/3 and no new changes have been made to his exercise program. Jesse Soto has not attended since 07/02/16.      Discharge Exercise Prescription (Final Exercise Prescription Changes):     Exercise Prescription Changes - 07/26/16 1400      Exercise Review   Progression Yes     Response to Exercise   Blood Pressure (Admit) 104/66   Blood Pressure (Exercise) 132/68   Blood Pressure (Exit) 124/72   Heart Rate (Admit) 81 bpm   Heart Rate (Exercise) 100 bpm   Heart Rate (Exit) 72 bpm   Rating of Perceived Exertion (Exercise) 14   Symptoms none   Duration Progress to 45 minutes of aerobic exercise without signs/symptoms of physical distress   Intensity THRR unchanged      Resistance Training   Training Prescription Yes   Weight 3   Reps 10-15     T5 Nustep   Level 4   Minutes 20      Nutrition:  Target Goals: Understanding of nutrition guidelines, daily intake of sodium <1537m, cholesterol <2044m calories 30% from fat and 7% or less from saturated fats, daily to have 5 or more servings of fruits and vegetables.  Biometrics:     Pre Biometrics - 06/11/16 1253      Pre Biometrics   Height 5' 8.5" (1.74 m)   Weight 184 lb 8 oz (83.7 kg)   Waist Circumference 40 inches   Hip Circumference 40 inches   Waist to Hip Ratio  1 %   BMI (Calculated) 27.7       Nutrition Therapy Plan and Nutrition Goals:     Nutrition Therapy & Goals - 06/29/16 1211      Nutrition Therapy   Diet Instructed on a heart healthy diet  based on 1900 calories and DASH diet principles.   Drug/Food Interactions Statins/Certain Fruits   Protein (specify units) 8   Fiber 30 grams   Whole Grain Foods 3 servings   Saturated Fats 13 max. grams   Fruits and Vegetables 5 servings/day   Sodium 2000 grams  1565m ideal     Personal Nutrition Goals   Personal Goal #1 Read labels for saturated fat, trans fat and sodium.   Personal Goal #2 Include a vegetable or fruit or preferably both at meals.   Personal Goal #3 Include more whole grain servings. Refer to list.     Intervention Plan   Intervention Prescribe, educate and counsel regarding individualized specific dietary modifications aiming towards targeted core components such as weight, hypertension, lipid management, diabetes, heart failure and other comorbidities.;Nutrition handout(s) given to patient.   Expected Outcomes Short Term Goal: Understand basic principles of dietary content, such as calories, fat, sodium, cholesterol and nutrients.;Short Term Goal: A plan has been developed with personal nutrition goals set during dietitian appointment.;Long Term Goal: Adherence to prescribed nutrition plan.      Nutrition  Discharge: Rate Your Plate Scores:     Nutrition Assessments - 06/29/16 1215      Rate Your Plate Scores   Pre Score 56   Pre Score % 62 %      Nutrition Goals Re-Evaluation:   Psychosocial: Target Goals: Acknowledge presence or absence of depression, maximize coping skills, provide positive support system. Participant is able to verbalize types and ability to use techniques and skills needed for reducing stress and depression.  Initial Review & Psychosocial Screening:     Initial Psych Review & Screening - 06/11/16 1321      Initial Review   Current issues with --  RReshaundenies depression or history of depression.       Family Dynamics   Good Support System? Yes  REarleystates his wife SLarene Beachis very supportive.  They have 2 children and no grandchildren.       Barriers   Psychosocial barriers to participate in program There are no identifiable barriers or psychosocial needs.     Screening Interventions   Interventions Encouraged to exercise;Program counselor consult      Quality of Life Scores:     Quality of Life - 06/11/16 1323      Quality of Life Scores   Health/Function Pre 21.2 %   Socioeconomic Pre 26.25 %   Psych/Spiritual Pre 30 %   Family Pre 27.6 %   GLOBAL Pre 25.03 %      PHQ-9: Recent Review Flowsheet Data    [facility]   Decreased Interest 0   Down, Depressed, Hopeless 0   PHQ - 2 Score 0   Altered sleeping 0   Tired, decreased energy 1   Change in appetite 0   Feeling bad or failure about yourself  0   Trouble concentrating 0   Moving slowly or fidgety/restless 1   Suicidal thoughts 0   PHQ-9 Score 2   Difficult doing work/chores Somewhat difficult      Psychosocial Evaluation and Intervention:     Psychosocial Evaluation - 06/13/16 1719      Psychosocial  Evaluation & Interventions   Interventions Encouraged to exercise with the program and follow exercise prescription;Stress management  education;Relaxation education   Comments Counselor met with Jesse Soto today for initial psychosocial evaluation.  He is 61 years old today and is in this program subsequent to a stent insertion in April.  He has a strong support system with a spouse of 15 years and a set of twin daughters.  He also is involved in his local church at times.  Jesse Soto has a bad knee which has prohibited working out consistently in the past.  He states that he is sleeping better now and his appetite has improved for healthier options.  Jesse Soto denies a history of depression or anxiety, but reported since taking some of the newly prescribed medications he has noticed he is more easily irritated and angry.  He states that one of the medications causes a dull headache which contributes to his current mood.  Jesse Soto states he has spoken to his doctor about this without much success.  He goes back to work on 7/8 and states his job can be stressful at times.  Jesse Soto was very short of breath today but was committed to the exercise program.   Counselor will follow with him throughout his time in Cardiac Rehab.   Continued Psychosocial Services Needed Yes  Jesse Soto will benefit from the psychoeducational components of this program - especially stress management and relaxation.       Psychosocial Re-Evaluation:     Psychosocial Re-Evaluation    [place] Name 06/20/16 1721             Psychosocial Re-Evaluation   Comments Counselor follow up with Jesse Soto today reporting this program is "just what I needed."  He is experiencing fewer negative symptoms and already has more energy since beginning.  He was also breathing better today on the treadmill and stated his headaches are not as severe now.  Counselor commended him on his commitment to improved health and consistent exercise.            Vocational Rehabilitation: Provide vocational rehab assistance to qualifying candidates.   Vocational Rehab  Evaluation & Intervention:     Vocational Rehab - 06/11/16 1313      Initial Vocational Rehab Evaluation & Intervention   Assessment shows need for Vocational Rehabilitation No      Education: Education Goals: Education classes will be provided on a weekly basis, covering required topics. Participant will state understanding/return demonstration of topics presented.  Learning Barriers/Preferences:     Learning Barriers/Preferences - 06/11/16 1309      Learning Barriers/Preferences   Learning Barriers Sight;Exercise Concerns  Patient reports vision has changed since MI.  Seeing Ophthalmologist on June 15th, 2017.     Learning Preferences None      Education Topics: General Nutrition Guidelines/Fats and Fiber: -Group instruction provided by verbal, written material, models and posters to present the general guidelines for heart healthy nutrition. Gives an explanation and review of dietary fats and fiber.   Controlling Sodium/Reading Food Labels: -Group verbal and written material supporting the discussion of sodium use in heart healthy nutrition. Review and explanation with models, verbal and written materials for utilization of the food label.   Exercise Physiology & Risk Factors: - Group verbal and written instruction with models to review the exercise physiology of the cardiovascular system and associated critical values. Details cardiovascular disease risk factors and the goals associated with each risk factor. Flowsheet Row  Cardiac Rehab from 06/27/2016 in [facility] Cardiac and Pulmonary Rehab  Date  06/13/16  Educator  AS  Instruction Review Code  2- meets goals/outcomes      Aerobic Exercise & Resistance Training: - Gives group verbal and written discussion on the health impact of inactivity. On the components of aerobic and resistive training programs and the benefits of this training and how to safely progress through these programs.   Flexibility, Balance, General  Exercise Guidelines: - Provides group verbal and written instruction on the benefits of flexibility and balance training programs. Provides general exercise guidelines with specific guidelines to those with heart or lung disease. Demonstration and skill practice provided. Flowsheet Row Cardiac Rehab from 06/27/2016 in [facility] Cardiac and Pulmonary Rehab  Date  06/20/16  Educator  AS  Instruction Review Code  2- meets goals/outcomes      Stress Management: - Provides group verbal and written instruction about the health risks of elevated stress, cause of high stress, and healthy ways to reduce stress. Flowsheet Row Cardiac Rehab from 06/27/2016 in [facility] Cardiac and Pulmonary Rehab  Date  06/27/16  Educator  Kathreen Cornfield, [facility]  Instruction Review Code  2- meets goals/outcomes      Depression: - Provides group verbal and written instruction on the correlation between heart/lung disease and depressed mood, treatment options, and the stigmas associated with seeking treatment.   Anatomy & Physiology of the Heart: - Group verbal and written instruction and models provide basic cardiac anatomy and physiology, with the coronary electrical and arterial systems. Review of: AMI, Angina, Valve disease, Heart Failure, Cardiac Arrhythmia, Pacemakers, and the ICD.   Cardiac Procedures: - Group verbal and written instruction and models to describe the testing methods done to diagnose heart disease. Reviews the outcomes of the test results. Describes the treatment choices: Medical Management, Angioplasty, or Coronary Bypass Surgery.   Cardiac Medications: - Group verbal and written instruction to review commonly prescribed medications for heart disease. Reviews the medication, class of the drug, and side effects. Includes the steps to properly store meds and maintain the prescription regimen.   Go Sex-Intimacy & Heart Disease, Get SMART - Goal Setting: - Group verbal and written instruction through game  format to discuss heart disease and the return to sexual intimacy. Provides group verbal and written material to discuss and apply goal setting through the application of the S.M.A.R.T. Method.   Other Matters of the Heart: - Provides group verbal, written materials and models to describe Heart Failure, Angina, Valve Disease, and Diabetes in the realm of heart disease. Includes description of the disease process and treatment options available to the cardiac patient.   Exercise & Equipment Safety: - Individual verbal instruction and demonstration of equipment use and safety with use of the equipment. Flowsheet Row Cardiac Rehab from 06/11/2016 in [facility] Cardiac and Pulmonary Rehab  Date  06/11/16  Educator  D.W.  Instruction Review Code  1- partially meets, needs review/practice      Infection Prevention: - Provides verbal and written material to individual with discussion of infection control including proper hand washing and proper equipment cleaning during exercise session. Flowsheet Row Cardiac Rehab from 06/11/2016 in [facility] Cardiac and Pulmonary Rehab  Date  06/11/16  Educator  DW  Instruction Review Code  2- meets goals/outcomes      Falls Prevention: - Provides verbal and written material to individual with discussion of falls prevention and safety. Flowsheet Row Cardiac Rehab from 06/11/2016 in [facility] Cardiac and Pulmonary Rehab  Date  06/11/16  Educator  DW  Instruction Review Code  2- meets goals/outcomes      Diabetes: - Individual verbal and written instruction to review signs/symptoms of diabetes, desired ranges of glucose level fasting, after meals and with exercise. Advice that pre and post exercise glucose checks will be done for 3 sessions at entry of program.    Knowledge Questionnaire Score:     Knowledge Questionnaire Score - 06/11/16 1313      Knowledge Questionnaire Score   Pre Score 21/28      Core Components/Risk Factors/Patient Goals at Admission:      Personal Goals and Risk Factors at Admission - 06/11/16 1316      Core Components/Risk Factors/Patient Goals on Admission    Weight Management Yes;Weight Maintenance   Intervention Weight Management: Provide education and appropriate resources to help participant work on and attain dietary goals.;Weight Management: Develop a combined nutrition and exercise program designed to reach desired caloric intake, while maintaining appropriate intake of nutrient and fiber, sodium and fats, and appropriate energy expenditure required for the weight goal.   Admit Weight 184 lb 8 oz (83.7 kg)   Goal Weight: Short Term 184 lb 8 oz (83.7 kg)   Goal Weight: Long Term 184 lb 8 oz (83.7 kg)   Expected Outcomes Weight Maintenance: Understanding of the daily nutrition guidelines, which includes 25-35% calories from fat, 7% or less cal from saturated fats, less than 256m cholesterol, less than 1.5gm of sodium, & 5 or more servings of fruits and vegetables daily   Sedentary Yes   Intervention Provide advice, education, support and counseling about physical activity/exercise needs.;Develop an individualized exercise prescription for aerobic and resistive training based on initial evaluation findings, risk stratification, comorbidities and participant's personal goals.   Expected Outcomes Achievement of increased cardiorespiratory fitness and enhanced flexibility, muscular endurance and strength shown through measurements of functional capacity and personal statement of participant.   Increase Strength and Stamina Yes   Intervention Provide advice, education, support and counseling about physical activity/exercise needs.;Develop an individualized exercise prescription for aerobic and resistive training based on initial evaluation findings, risk stratification, comorbidities and participant's personal goals.   Expected Outcomes Achievement of increased cardiorespiratory fitness and enhanced flexibility, muscular  endurance and strength shown through measurements of functional capacity and personal statement of participant.   Hypertension Yes   Intervention Provide education on lifestyle modifcations including regular physical activity/exercise, weight management, moderate sodium restriction and increased consumption of fresh fruit, vegetables, and low fat dairy, alcohol moderation, and smoking cessation.;Monitor prescription use compliance.   Expected Outcomes Short Term: Continued assessment and intervention until BP is < 140/949mHG in hypertensive participants. < 130/8034mG in hypertensive participants with diabetes, heart failure or chronic kidney disease.;Long Term: Maintenance of blood pressure at goal levels.   Lipids Yes   Intervention Provide education and support for participant on nutrition & aerobic/resistive exercise along with prescribed medications to achieve LDL <60m30mDL >40mg66mExpected Outcomes Short Term: Participant states understanding of desired cholesterol values and is compliant with medications prescribed. Participant is following exercise prescription and nutrition guidelines.;Long Term: Cholesterol controlled with medications as prescribed, with individualized exercise RX and with personalized nutrition plan. Value goals: LDL < 60mg,34m > 40 mg.      Core Components/Risk Factors/Patient Goals Review:      Goals and Risk Factor Review    Row Name 06/28/16 1333             Core Components/Risk Factors/Patient  Goals Review   Personal Goals Review Increase Strength and Stamina       Review Jesse Soto stated he is feeling stronger since begnning Heart Track.       Expected Outcomes Jesse Soto will continue to be consistent with exercise and see improvement in overall function.          Core Components/Risk Factors/Patient Goals at Discharge (Final Review):      Goals and Risk Factor Review - 06/28/16 1333      Core Components/Risk Factors/Patient Goals Review   Personal Goals  Review Increase Strength and Stamina   Review Jesse Soto stated he is feeling stronger since begnning Heart Track.   Expected Outcomes Jesse Soto will continue to be consistent with exercise and see improvement in overall function.      ITP Comments:     ITP Comments    Row Name 06/20/16 0927 06/20/16 1830 06/20/16 1831 06/25/16 1902 07/18/16 0802   ITP Comments 30 day review.  Continue with ITP  New to program Jesse Soto informed staff in Cardiac Rehab today that he had stopped taking his Imdur 30 mg Jesse Soto informed staff in Cardiac Rehab today that he had stopped taking his Imdur 30 mg once a day because he was having a constant headache.  He stated he has not taken it for the past three days.  He did not take it on Monday because he had to drive to the [place] in [place] for an appointment and he just could not drive there with a headache.  He states his SOB has improved and he has not had any chest pain.  He was able to do 5 minutes warm up on treadmill then 15 minutes on Treadmill at 2.5 mph.  Will contact Dr. Tyrell Antonio nurse to inform MD that patient has stopped taking Imdur.  No cardiac symptoms reported while exercising.   Jesse Soto did not complete his rehab session.  Jesse Soto presented to Cardiac Rehab today at 4 p.m. As scheduled.  He stated he did not think he was going to be able to exercise, as he was having left groin pain.  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  He stated he took him over 15 minutes to walk from his car to get to the rehab gym.  Nurse agreed that he would not be able to exercise.  Nurse instructed him to get evaluated by MD prior to returning to cardiac rehab.  Nurse asked patient if he needed assistance getting to his car.  He refused offer of assistance in getting to his car.   30 day review. Continue with ITP   Row Name 07/26/16 1421 08/15/16 0831         ITP Comments Mr Soto hasnt attended since 7/3.  LMOM about returning to class. 30 day review. Continue with  ITP unless changes noted by Medical Director at signature of review.         Comments:

## 2016-08-15 NOTE — Progress Notes (Signed)
Daily Session Note  Patient Details  Name: Jesse Soto MRN: [id number] Date of Birth: [birth year] Referring Provider:   Flowsheet Row Cardiac Rehab from 06/11/2016 in [facility] Cardiac and Pulmonary Rehab  Referring Provider  Kathlyn Sacramento MD      Encounter Date: 08/15/2016  Check In:     Session Check In - 08/15/16 1734      Check-In   Location [facility]-Cardiac & Pulmonary Rehab   Staff Present Roanna Epley, RN, Vickki Hearing, BA, ACSM CEP, Exercise Physiologist   Supervising physician immediately available to respond to emergencies See telemetry face sheet for immediately available ER MD   Medication changes reported     No   Fall or balance concerns reported    No   Warm-up and Cool-down Performed on first and last piece of equipment     Pain Assessment   Currently in Pain? No/denies         Goals Met:  Proper associated with RPD/PD & O2 Sat Exercise tolerated well  Goals Unmet:  Not Applicable  Comments:     Dr. Emily Filbert is Medical Director for [place] and [facility].

## 2016-08-20 DIAGNOSIS — I214 Non-ST elevation (NSTEMI) myocardial infarction: Secondary | ICD-10-CM | POA: Diagnosis not present

## 2016-08-20 DIAGNOSIS — Z9861 Coronary angioplasty status: Secondary | ICD-10-CM

## 2016-08-20 NOTE — Progress Notes (Signed)
Daily Session Note  Patient Details  Name: Jesse Soto MRN: [id number] Date of Birth: 1955-02-13 Referring Provider:   Flowsheet Row Cardiac Rehab from 06/11/2016 in [facility] Cardiac and Pulmonary Rehab  Referring Provider  Kathlyn Sacramento MD      Encounter Date: 08/20/2016  Check In:     Session Check In - 08/20/16 1711      Check-In   Location [facility]-Cardiac & Pulmonary Rehab   Staff Present Earlean Shawl, BS, ACSM CEP, Exercise Physiologist;Robecca Fulgham Oletta Darter, BA, ACSM CEP, Exercise Physiologist;Carroll Enterkin, RN, BSN   Supervising physician immediately available to respond to emergencies See telemetry face sheet for immediately available ER MD   Medication changes reported     No   Fall or balance concerns reported    No   Warm-up and Cool-down Performed on first and last piece of equipment   Resistance Training Performed Yes   VAD Patient? No     Pain Assessment   Currently in Pain? No/denies   Multiple Pain Sites No         Goals Met:  Proper associated with RPD/PD & O2 Sat Independence with exercise equipment No report of cardiac concerns or symptoms Strength training completed today  Goals Unmet:  Not Applicable  Comments: Pt able to follow exercise prescription today without complaint.  Will continue to monitor for progression.  Reviewed home exercise with pt today.  Pt plans to walk at his workplace for exercise.  Reviewed THR, pulse, RPE, sign and symptoms, NTG use, and when to call 911 or MD.  Also discussed weather considerations and indoor options.  Pt voiced understanding.     Dr. Emily Filbert is Medical Director for [place] and [facility].

## 2016-08-22 DIAGNOSIS — I214 Non-ST elevation (NSTEMI) myocardial infarction: Secondary | ICD-10-CM

## 2016-08-22 DIAGNOSIS — Z9861 Coronary angioplasty status: Secondary | ICD-10-CM

## 2016-08-22 NOTE — Progress Notes (Signed)
Daily Session Note  Patient Details  Name: Jesse Soto MRN: [id number] Date of Birth: Apr 05, 1955 Referring Provider:   Flowsheet Row Cardiac Rehab from 06/11/2016 in [facility] Cardiac and Pulmonary Rehab  Referring Provider  Kathlyn Sacramento MD      Encounter Date: 08/22/2016  Check In:     Session Check In - 08/22/16 1714      Check-In   Location [facility]-Cardiac & Pulmonary Rehab   Staff Present Nyoka Cowden, RN, BSN, Willette Pa, MA, ACSM RCEP, Exercise Physiologist;Amanda Oletta Darter, [place], ACSM CEP, Exercise Physiologist   Supervising physician immediately available to respond to emergencies See telemetry face sheet for immediately available ER MD   Medication changes reported     No   Fall or balance concerns reported    No   Warm-up and Cool-down Performed on first and last piece of equipment   Resistance Training Performed No  late arrival   VAD Patient? No     Pain Assessment   Currently in Pain? No/denies   Multiple Pain Sites No         Goals Met:  Independence with exercise equipment Exercise tolerated well No report of cardiac concerns or symptoms Strength training completed today  Goals Unmet:  Not Applicable  Comments: Pt able to follow exercise prescription today without complaint.  Will continue to monitor for progression.    Dr. Emily Filbert is Medical Director for [place] and [facility].

## 2016-09-05 ENCOUNTER — Encounter: Payer: Managed Care, Other (non HMO) | Attending: Cardiovascular Disease

## 2016-09-05 DIAGNOSIS — I214 Non-ST elevation (NSTEMI) myocardial infarction: Secondary | ICD-10-CM | POA: Insufficient documentation

## 2016-09-12 ENCOUNTER — Encounter: Payer: Self-pay | Admitting: *Deleted

## 2016-09-12 ENCOUNTER — Telehealth: Payer: Self-pay | Admitting: Cardiovascular Disease

## 2016-09-12 DIAGNOSIS — I214 Non-ST elevation (NSTEMI) myocardial infarction: Secondary | ICD-10-CM

## 2016-09-12 DIAGNOSIS — Z9861 Coronary angioplasty status: Secondary | ICD-10-CM

## 2016-09-12 NOTE — Telephone Encounter (Signed)
Calling stating his job his offering flu shots and wanted to know if OK to get shot.  Advised that yes he could get the flu shot.

## 2016-09-12 NOTE — Progress Notes (Signed)
Cardiac Individual Treatment Plan  Patient Details  Name: Jesse Soto MRN: [id number] Date of Birth: Jan 30, 1955 Referring Provider:   Flowsheet Row Cardiac Rehab from 06/11/2016 in [facility] Cardiac and Pulmonary Rehab  Referring Provider  Kathlyn Sacramento MD      Initial Encounter Date:  Flowsheet Row Cardiac Rehab from 06/11/2016 in [facility] Cardiac and Pulmonary Rehab  Date  06/11/16  Referring Provider  Kathlyn Sacramento MD      Visit Diagnosis: NSTEMI (non-ST elevated myocardial infarction) ([place])  S/P PTCA (percutaneous transluminal coronary angioplasty)  Patient's Home Medications on Admission:  Current Outpatient Prescriptions:  .  aspirin 81 MG chewable tablet, Chew 1 tablet (81 mg total) by mouth daily., Disp: 30 tablet, Rfl: 0 .  atorvastatin (LIPITOR) 40 MG tablet, Take 1 tablet (40 mg total) by mouth daily at 6 PM., Disp: 90 tablet, Rfl: 1 .  metoprolol tartrate (LOPRESSOR) 25 MG tablet, Take 1 tablet (25 mg total) by mouth 2 (two) times daily., Disp: 180 tablet, Rfl: 1 .  nitroGLYCERIN (NITROSTAT) 0.4 MG SL tablet, Place 1 tablet (0.4 mg total) under the tongue every 5 (five) minutes as needed for chest pain., Disp: 25 tablet, Rfl: 3 .  pantoprazole (PROTONIX) 40 MG tablet, Take 1 tablet (40 mg total) by mouth daily., Disp: 90 tablet, Rfl: 1 .  ticagrelor (BRILINTA) 90 MG TABS tablet, Take 1 tablet (90 mg total) by mouth 2 (two) times daily., Disp: 180 tablet, Rfl: 1 .  traMADol (ULTRAM) 50 MG tablet, Take 50 mg by mouth daily. , Disp: , Rfl:   Past Medical History: Past Medical History:  Diagnosis Date  . Abdominal hernia   . Coronary artery disease 03/2016    non-ST elevation myocardial infarction. Cardiac catheterization showed 95% proximal RCA stenosis with very large thrombus, moderate mid RCA stenosis, occluded distal left circumflex supplying OM 3 territory with collaterals and moderate mid LAD stenosis. EF was 45%. Successful aspiration thrombectomy and drug-eluting  stent placement to the proximal right coronary artery   . HLD (hyperlipidemia)   . Low back pain   . Psoriasis   . Tobacco abuse     Tobacco Use: History  Smoking Status  . Former Smoker  . Packs/day: 1.00  . Years: 30.00  . Types: Cigarettes  . Quit date: 04/01/2016  Smokeless Tobacco  . Never Used    Labs: Recent Review Flowsheet Data    Labs for ITP Cardiac and Pulmonary Rehab Latest Ref Rng & Units 04/01/2016 05/10/2016   Cholestrol 100 - 199 mg/dL 199 132   LDLCALC 0 - 99 mg/dL 153(H) 77   HDL >39 mg/dL 31(L) 33(L)   Trlycerides 0 - 149 mg/dL 76 108   Hemoglobin A1c 4.0 - 6.0 % 5.7 -       Exercise Target Goals:    Exercise Program Goal: Individual exercise prescription set with THRR, safety & activity barriers. Participant demonstrates ability to understand and report RPE using BORG scale, to self-measure pulse accurately, and to acknowledge the importance of the exercise prescription.  Exercise Prescription Goal: Starting with aerobic activity 30 plus minutes a day, 3 days per week for initial exercise prescription. Provide home exercise prescription and guidelines that participant acknowledges understanding prior to discharge.  Activity Barriers & Risk Stratification:     Activity Barriers & Cardiac Risk Stratification - 06/11/16 1306      Activity Barriers & Cardiac Risk Stratification   Activity Barriers --  Patient reports having had right knee cartilage removed while in  the [employer].  States right knee "floats" requiring him to wear a brace.  As a result, this affects his lower back - two vertebrae are irritated due to his right knee problems.        6 Minute Walk:     6 Minute Walk    Row Name 06/11/16 1249         6 Minute Walk   Phase Initial     Distance 1300 feet     Walk Time 6 minutes     # of Rest Breaks 0     MPH 2.46     METS 3.65     RPE 13     Perceived Dyspnea  2     VO2 Peak 12.77     Symptoms No     Resting HR 47 bpm     Resting  BP 122/70     Max Ex. HR 113 bpm     Max Ex. BP 132/68        Initial Exercise Prescription:     Initial Exercise Prescription - 06/11/16 1200      Date of Initial Exercise RX and Referring Provider   Date 06/11/16   Referring Provider Kathlyn Sacramento MD     Treadmill   MPH 2.2   Grade 1   Minutes 15   METs 2.99     NuStep   Level 3   Watts 30   Minutes 15   METs 2     Recumbant Elliptical   Level 3   RPM 50   Watts 30   Minutes 15   METs 2     REL-XR   Level 2   Watts 30   Minutes 15   METs 2     T5 Nustep   Level 2   Watts 30   Minutes 15   METs 2     Biostep-RELP   Level 3   Watts 30   Minutes 15   METs 2     Prescription Details   Frequency (times per week) 3   Duration Progress to 30 minutes of continuous aerobic without signs/symptoms of physical distress     Intensity   THRR 40-80% of Max Heartrate 93-138   Ratings of Perceived Exertion 11-13   Perceived Dyspnea 0-4     Progression   Progression Continue to progress workloads to maintain intensity without signs/symptoms of physical distress.     Resistance Training   Training Prescription Yes   Weight 2   Reps 10-12      Perform Capillary Blood Glucose checks as needed.  Exercise Prescription Changes:     Exercise Prescription Changes    Row Name 06/15/16 1000 06/29/16 1000 07/26/16 1400 08/20/16 1700 08/23/16 1300     Exercise Review   Progression Yes Yes Yes Yes Yes     Response to Exercise   Blood Pressure (Admit) 134/80 124/64 104/66  - 132/60   Blood Pressure (Exercise) 148/72 138/68 132/68  - 140/76   Blood Pressure (Exit) 134/68 110/60 124/72  - 128/74   Heart Rate (Admit) 66 bpm 84 bpm 81 bpm  - 80 bpm   Heart Rate (Exercise) 81 bpm 109 bpm 100 bpm  - 108 bpm   Heart Rate (Exit) 63 bpm 66 bpm 72 bpm  - 89 bpm   Rating of Perceived Exertion (Exercise) '15 13 14  '$ - 13   Symptoms  -  - none  -  -  Duration  -  - Progress to 45 minutes of aerobic exercise without  signs/symptoms of physical distress Progress to 45 minutes of aerobic exercise without signs/symptoms of physical distress Progress to 45 minutes of aerobic exercise without signs/symptoms of physical distress   Intensity  -  - THRR unchanged THRR unchanged THRR unchanged     Progression   Average METs  -  -  -  - 3.3     Resistance Training   Training Prescription Yes Yes Yes Yes  -   Weight '2 3 3 3  '$ -   Reps 10-12 10-15 10-15 10-15  -     Treadmill   MPH 1.9 2.5  -  - 3   Grade 0 0  -  - 0   Minutes 8 20  -  - 25   METs 2.45 2.9  -  - 3.3     NuStep   Level 4  -  -  -  -   Watts 24  -  -  -  -   Minutes 20  -  -  -  -   METs 2  -  -  -  -     REL-XR   Level 1  -  -  -  -   Watts 69  -  -  -  -   Minutes 10  -  -  -  -   METs 3.8  -  -  -  -     T5 Nustep   Level  -  - 4 4  -   Minutes  -  - 20 20  -     Home Exercise Plan   Plans to continue exercise at  -  -  - Home  Plans to walk at work   -   Frequency  -  -  - Add 2 additional days to program exercise sessions.  -      Exercise Comments:     Exercise Comments    Row Name 06/11/16 1255 06/13/16 1743 06/15/16 1040 07/12/16 1359 08/09/16 1531   Exercise Comments Wears knee brace for extra support.  He would like to increase his stamina while here in program. First full day of exercise!  Patient was oriented to gym and equipment including functions, settings, policies, and procedures.  Patient's individual exercise prescription and treatment plan were reviewed.  All starting workloads were established based on the results of the 6 minute walk test done at initial orientation visit.  The plan for exercise progression was also introduced and progression will be customized based on patient's performance and goals. Ilyaas has completed 2 days of exercise and has tolerated sessions well. Andrewjames's last session was 7/3 and no new changes have been made to his exercise program. Steffen has not attended since 07/02/16.   [place] Name  08/20/16 1753 08/23/16 1319 09/06/16 1120       Exercise Comments Reviewed home exercise with pt today.  Pt plans to walk at his workplace for exercise.  Reviewed THR, pulse, RPE, sign and symptoms, NTG use, and when to call 911 or MD.  Also discussed weather considerations and indoor options.  Pt voiced understanding. Mr Nazir is progressing well with exercise and has added in using the TM at work.  His knee has bothered him at times. Lucas has not attended since 08/22/16.  He works in [place] and has trouble getting here since he has started back to work.  Discharge Exercise Prescription (Final Exercise Prescription Changes):     Exercise Prescription Changes - 08/23/16 1300      Exercise Review   Progression Yes     Response to Exercise   Blood Pressure (Admit) 132/60   Blood Pressure (Exercise) 140/76   Blood Pressure (Exit) 128/74   Heart Rate (Admit) 80 bpm   Heart Rate (Exercise) 108 bpm   Heart Rate (Exit) 89 bpm   Rating of Perceived Exertion (Exercise) 13   Duration Progress to 45 minutes of aerobic exercise without signs/symptoms of physical distress   Intensity THRR unchanged     Progression   Average METs 3.3     Treadmill   MPH 3   Grade 0   Minutes 25   METs 3.3      Nutrition:  Target Goals: Understanding of nutrition guidelines, daily intake of sodium '1500mg'$ , cholesterol '200mg'$ , calories 30% from fat and 7% or less from saturated fats, daily to have 5 or more servings of fruits and vegetables.  Biometrics:     Pre Biometrics - 06/11/16 1253      Pre Biometrics   Height 5' 8.5" (1.74 m)   Weight 184 lb 8 oz (83.7 kg)   Waist Circumference 40 inches   Hip Circumference 40 inches   Waist to Hip Ratio 1 %   BMI (Calculated) 27.7       Nutrition Therapy Plan and Nutrition Goals:     Nutrition Therapy & Goals - 06/29/16 1211      Nutrition Therapy   Diet Instructed on a heart healthy diet  based on 1900 calories and DASH diet  principles.   Drug/Food Interactions Statins/Certain Fruits   Protein (specify units) 8   Fiber 30 grams   Whole Grain Foods 3 servings   Saturated Fats 13 max. grams   Fruits and Vegetables 5 servings/day   Sodium 2000 grams  '1500mg'$  ideal     Personal Nutrition Goals   Personal Goal #1 Read labels for saturated fat, trans fat and sodium.   Personal Goal #2 Include a vegetable or fruit or preferably both at meals.   Personal Goal #3 Include more whole grain servings. Refer to list.     Intervention Plan   Intervention Prescribe, educate and counsel regarding individualized specific dietary modifications aiming towards targeted core components such as weight, hypertension, lipid management, diabetes, heart failure and other comorbidities.;Nutrition handout(s) given to patient.   Expected Outcomes Short Term Goal: Understand basic principles of dietary content, such as calories, fat, sodium, cholesterol and nutrients.;Short Term Goal: A plan has been developed with personal nutrition goals set during dietitian appointment.;Long Term Goal: Adherence to prescribed nutrition plan.      Nutrition Discharge: Rate Your Plate Scores:     Nutrition Assessments - 06/29/16 1215      Rate Your Plate Scores   Pre Score 56   Pre Score % 62 %      Nutrition Goals Re-Evaluation:   Psychosocial: Target Goals: Acknowledge presence or absence of depression, maximize coping skills, provide positive support system. Participant is able to verbalize types and ability to use techniques and skills needed for reducing stress and depression.  Initial Review & Psychosocial Screening:     Initial Psych Review & Screening - 06/11/16 1321      Initial Review   Current issues with --  Jabbar denies depression or history of depression.       Family Dynamics   Good Support System? Yes  Keshun states his wife Larene Beach is very supportive.  They have 2 children and no grandchildren.       Barriers    Psychosocial barriers to participate in program There are no identifiable barriers or psychosocial needs.     Screening Interventions   Interventions Encouraged to exercise;Program counselor consult      Quality of Life Scores:     Quality of Life - 06/11/16 1323      Quality of Life Scores   Health/Function Pre 21.2 %   Socioeconomic Pre 26.25 %   Psych/Spiritual Pre 30 %   Family Pre 27.6 %   GLOBAL Pre 25.03 %      PHQ-9: Recent Review Flowsheet Data    Depression screen [facility] 2/9 06/11/2016   Decreased Interest 0   Down, Depressed, Hopeless 0   PHQ - 2 Score 0   Altered sleeping 0   Tired, decreased energy 1   Change in appetite 0   Feeling bad or failure about yourself  0   Trouble concentrating 0   Moving slowly or fidgety/restless 1   Suicidal thoughts 0   PHQ-9 Score 2   Difficult doing work/chores Somewhat difficult      Psychosocial Evaluation and Intervention:     Psychosocial Evaluation - 06/13/16 1719      Psychosocial Evaluation & Interventions   Interventions Encouraged to exercise with the program and follow exercise prescription;Stress management education;Relaxation education   Comments Counselor met with Mr. Doughten today for initial psychosocial evaluation.  He is 61 years old today and is in this program subsequent to a stent insertion in April.  He has a strong support system with a spouse of 15 years and a set of twin daughters.  He also is involved in his local church at times.  Mr. Rosenbloom has a bad knee which has prohibited working out consistently in the past.  He states that he is sleeping better now and his appetite has improved for healthier options.  Mr. Arriaga denies a history of depression or anxiety, but reported since taking some of the newly prescribed medications he has noticed he is more easily irritated and angry.  He states that one of the medications causes a dull headache which contributes to his current mood.  Mr. Paredez states  he has spoken to his doctor about this without much success.  He goes back to work on 7/8 and states his job can be stressful at times.  Mr. Raymundo was very short of breath today but was committed to the exercise program.   Counselor will follow with him throughout his time in Cardiac Rehab.   Continued Psychosocial Services Needed Yes  Mr. Baudoin will benefit from the psychoeducational components of this program - especially stress management and relaxation.       Psychosocial Re-Evaluation:     Psychosocial Re-Evaluation    [place] Name 06/20/16 1721             Psychosocial Re-Evaluation   Comments Counselor follow up with Mr. Macon today reporting this program is "just what I needed."  He is experiencing fewer negative symptoms and already has more energy since beginning.  He was also breathing better today on the treadmill and stated his headaches are not as severe now.  Counselor commended him on his commitment to improved health and consistent exercise.            Vocational Rehabilitation: Provide vocational rehab assistance to qualifying candidates.   Vocational Rehab  Evaluation & Intervention:     Vocational Rehab - 06/11/16 1313      Initial Vocational Rehab Evaluation & Intervention   Assessment shows need for Vocational Rehabilitation No      Education: Education Goals: Education classes will be provided on a weekly basis, covering required topics. Participant will state understanding/return demonstration of topics presented.  Learning Barriers/Preferences:     Learning Barriers/Preferences - 06/11/16 1309      Learning Barriers/Preferences   Learning Barriers Sight;Exercise Concerns  Patient reports vision has changed since MI.  Seeing Ophthalmologist on June 15th, 2017.     Learning Preferences None      Education Topics: General Nutrition Guidelines/Fats and Fiber: -Group instruction provided by verbal, written material, models and posters to present  the general guidelines for heart healthy nutrition. Gives an explanation and review of dietary fats and fiber.   Controlling Sodium/Reading Food Labels: -Group verbal and written material supporting the discussion of sodium use in heart healthy nutrition. Review and explanation with models, verbal and written materials for utilization of the food label.   Exercise Physiology & Risk Factors: - Group verbal and written instruction with models to review the exercise physiology of the cardiovascular system and associated critical values. Details cardiovascular disease risk factors and the goals associated with each risk factor. Flowsheet Row Cardiac Rehab from 06/27/2016 in [facility] Cardiac and Pulmonary Rehab  Date  06/13/16  Educator  AS  Instruction Review Code  2- meets goals/outcomes      Aerobic Exercise & Resistance Training: - Gives group verbal and written discussion on the health impact of inactivity. On the components of aerobic and resistive training programs and the benefits of this training and how to safely progress through these programs.   Flexibility, Balance, General Exercise Guidelines: - Provides group verbal and written instruction on the benefits of flexibility and balance training programs. Provides general exercise guidelines with specific guidelines to those with heart or lung disease. Demonstration and skill practice provided. Flowsheet Row Cardiac Rehab from 06/27/2016 in [facility] Cardiac and Pulmonary Rehab  Date  06/20/16  Educator  AS  Instruction Review Code  2- meets goals/outcomes      Stress Management: - Provides group verbal and written instruction about the health risks of elevated stress, cause of high stress, and healthy ways to reduce stress. Flowsheet Row Cardiac Rehab from 06/27/2016 in [facility] Cardiac and Pulmonary Rehab  Date  06/27/16  Educator  Belva Crome, [facility]  Instruction Review Code  2- meets goals/outcomes      Depression: - Provides group verbal  and written instruction on the correlation between heart/lung disease and depressed mood, treatment options, and the stigmas associated with seeking treatment.   Anatomy & Physiology of the Heart: - Group verbal and written instruction and models provide basic cardiac anatomy and physiology, with the coronary electrical and arterial systems. Review of: AMI, Angina, Valve disease, Heart Failure, Cardiac Arrhythmia, Pacemakers, and the ICD.   Cardiac Procedures: - Group verbal and written instruction and models to describe the testing methods done to diagnose heart disease. Reviews the outcomes of the test results. Describes the treatment choices: Medical Management, Angioplasty, or Coronary Bypass Surgery.   Cardiac Medications: - Group verbal and written instruction to review commonly prescribed medications for heart disease. Reviews the medication, class of the drug, and side effects. Includes the steps to properly store meds and maintain the prescription regimen.   Go Sex-Intimacy & Heart Disease, Get SMART - Goal Setting: - Group  verbal and written instruction through game format to discuss heart disease and the return to sexual intimacy. Provides group verbal and written material to discuss and apply goal setting through the application of the S.M.A.R.T. Method.   Other Matters of the Heart: - Provides group verbal, written materials and models to describe Heart Failure, Angina, Valve Disease, and Diabetes in the realm of heart disease. Includes description of the disease process and treatment options available to the cardiac patient.   Exercise & Equipment Safety: - Individual verbal instruction and demonstration of equipment use and safety with use of the equipment. Flowsheet Row Cardiac Rehab from 06/11/2016 in [facility] Cardiac and Pulmonary Rehab  Date  06/11/16  Educator  D.W.  Instruction Review Code  1- partially meets, needs review/practice      Infection Prevention: -  Provides verbal and written material to individual with discussion of infection control including proper hand washing and proper equipment cleaning during exercise session. Flowsheet Row Cardiac Rehab from 06/11/2016 in [facility] Cardiac and Pulmonary Rehab  Date  06/11/16  Educator  DW  Instruction Review Code  2- meets goals/outcomes      Falls Prevention: - Provides verbal and written material to individual with discussion of falls prevention and safety. Flowsheet Row Cardiac Rehab from 06/11/2016 in [facility] Cardiac and Pulmonary Rehab  Date  06/11/16  Educator  DW  Instruction Review Code  2- meets goals/outcomes      Diabetes: - Individual verbal and written instruction to review signs/symptoms of diabetes, desired ranges of glucose level fasting, after meals and with exercise. Advice that pre and post exercise glucose checks will be done for 3 sessions at entry of program.    Knowledge Questionnaire Score:     Knowledge Questionnaire Score - 06/11/16 1313      Knowledge Questionnaire Score   Pre Score 21/28      Core Components/Risk Factors/Patient Goals at Admission:     Personal Goals and Risk Factors at Admission - 06/11/16 1316      Core Components/Risk Factors/Patient Goals on Admission    Weight Management Yes;Weight Maintenance   Intervention Weight Management: Provide education and appropriate resources to help participant work on and attain dietary goals.;Weight Management: Develop a combined nutrition and exercise program designed to reach desired caloric intake, while maintaining appropriate intake of nutrient and fiber, sodium and fats, and appropriate energy expenditure required for the weight goal.   Admit Weight 184 lb 8 oz (83.7 kg)   Goal Weight: Short Term 184 lb 8 oz (83.7 kg)   Goal Weight: Long Term 184 lb 8 oz (83.7 kg)   Expected Outcomes Weight Maintenance: Understanding of the daily nutrition guidelines, which includes 25-35% calories from fat, 7% or  less cal from saturated fats, less than '200mg'$  cholesterol, less than 1.5gm of sodium, & 5 or more servings of fruits and vegetables daily   Sedentary Yes   Intervention Provide advice, education, support and counseling about physical activity/exercise needs.;Develop an individualized exercise prescription for aerobic and resistive training based on initial evaluation findings, risk stratification, comorbidities and participant's personal goals.   Expected Outcomes Achievement of increased cardiorespiratory fitness and enhanced flexibility, muscular endurance and strength shown through measurements of functional capacity and personal statement of participant.   Increase Strength and Stamina Yes   Intervention Provide advice, education, support and counseling about physical activity/exercise needs.;Develop an individualized exercise prescription for aerobic and resistive training based on initial evaluation findings, risk stratification, comorbidities and participant's personal goals.  Expected Outcomes Achievement of increased cardiorespiratory fitness and enhanced flexibility, muscular endurance and strength shown through measurements of functional capacity and personal statement of participant.   Hypertension Yes   Intervention Provide education on lifestyle modifcations including regular physical activity/exercise, weight management, moderate sodium restriction and increased consumption of fresh fruit, vegetables, and low fat dairy, alcohol moderation, and smoking cessation.;Monitor prescription use compliance.   Expected Outcomes Short Term: Continued assessment and intervention until BP is < 140/27m HG in hypertensive participants. < 130/817mHG in hypertensive participants with diabetes, heart failure or chronic kidney disease.;Long Term: Maintenance of blood pressure at goal levels.   Lipids Yes   Intervention Provide education and support for participant on nutrition & aerobic/resistive exercise  along with prescribed medications to achieve LDL '70mg'$ , HDL >'40mg'$ .   Expected Outcomes Short Term: Participant states understanding of desired cholesterol values and is compliant with medications prescribed. Participant is following exercise prescription and nutrition guidelines.;Long Term: Cholesterol controlled with medications as prescribed, with individualized exercise RX and with personalized nutrition plan. Value goals: LDL < '70mg'$ , HDL > 40 mg.      Core Components/Risk Factors/Patient Goals Review:      Goals and Risk Factor Review    Row Name 06/28/16 1333             Core Components/Risk Factors/Patient Goals Review   Personal Goals Review Increase Strength and Stamina       Review RiAadithtated he is feeling stronger since begnning Heart Track.       Expected Outcomes RiOshayill continue to be consistent with exercise and see improvement in overall function.          Core Components/Risk Factors/Patient Goals at Discharge (Final Review):      Goals and Risk Factor Review - 06/28/16 1333      Core Components/Risk Factors/Patient Goals Review   Personal Goals Review Increase Strength and Stamina   Review RiAtzeltated he is feeling stronger since begnning Heart Track.   Expected Outcomes RiKamirill continue to be consistent with exercise and see improvement in overall function.      ITP Comments:     ITP Comments    Row Name 06/20/16 0927 06/20/16 1830 06/20/16 1831 06/25/16 1902 07/18/16 0802   ITP Comments 30 day review.  Continue with ITP  New to program Aaro informed staff in Cardiac Rehab today that he had stopped taking his Imdur 30 mg Cruze informed staff in Cardiac Rehab today that he had stopped taking his Imdur 30 mg once a day because he was having a constant headache.  He stated he has not taken it for the past three days.  He did not take it on Monday because [facility] and he just could not drive there with a  headache.  He states his SOB has improved and he has not had any chest pain.  He was able to do 5 minutes warm up on treadmill then 15 minutes on Treadmill at 2.5 mph.  Will contact Dr. ArTyrell Antoniourse to inform MD that patient has stopped taking Imdur.  No cardiac symptoms reported while exercising.   RiDamaris Schoonerid not complete his rehab session.  Mariah presented to Cardiac Rehab today at 4 p.m. As scheduled.  He stated he did not think he was going to be able to exercise, as he was having left groin pain.  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  He stated he took him over 15 minutes to walk from his car to get to the rehab gym.  Nurse agreed that he would not be able to exercise.  Nurse instructed him to get evaluated by MD prior to returning to cardiac rehab.  Nurse asked patient if he needed assistance getting to his car.  He refused offer of assistance in getting to his car.   30 day review. Continue with ITP   Row Name 07/26/16 1421 08/15/16 0831 08/23/16 1320 09/06/16 1121 09/12/16 0704   ITP Comments Mr Boy hasnt attended since 7/3.  LMOM about returning to class. 30 day review. Continue with ITP unless changes noted by Medical Director at signature of review. Mr Polio is progressing well with exercise and has added in using the TM at work.  His knee has bothered him at times. Jessee has not attended since 08/22/16.  He works in [place] and has trouble getting here since he has started back to work 30 day review. Continue with ITP unless changes noted by Medical Director at signature of review.      Comments:

## 2016-09-12 NOTE — Telephone Encounter (Signed)
Pt calling stating they are giving flu shots at his job  And he is asking if he is allowed to get one Please call back

## 2016-10-03 ENCOUNTER — Telehealth: Payer: Self-pay

## 2016-10-03 NOTE — Telephone Encounter (Signed)
Jesse Soto has been exercising at work every day at lunch.  He sees his Dr tomorrow and will check if the Dr wants him to continue Heart Track.  He will call and lett us know if he can continue or needs to be discharged.

## 2016-10-04 ENCOUNTER — Ambulatory Visit (INDEPENDENT_AMBULATORY_CARE_PROVIDER_SITE_OTHER): Payer: Managed Care, Other (non HMO) | Admitting: Cardiovascular Disease

## 2016-10-04 ENCOUNTER — Encounter: Payer: Self-pay | Admitting: Cardiovascular Disease

## 2016-10-04 VITALS — BP 118/68 | HR 67 | Ht 69.0 in | Wt 188.8 lb

## 2016-10-04 DIAGNOSIS — I2510 Atherosclerotic heart disease of native coronary artery without angina pectoris: Secondary | ICD-10-CM | POA: Diagnosis not present

## 2016-10-04 DIAGNOSIS — R0602 Shortness of breath: Secondary | ICD-10-CM

## 2016-10-04 DIAGNOSIS — I10 Essential (primary) hypertension: Secondary | ICD-10-CM

## 2016-10-04 DIAGNOSIS — E7800 Pure hypercholesterolemia, unspecified: Secondary | ICD-10-CM

## 2016-10-04 MED ORDER — PANTOPRAZOLE SODIUM 40 MG PO TBEC: 40.0000 mg | DELAYED_RELEASE_TABLET | Freq: Every day | ORAL | 3 refills | Status: AC

## 2016-10-04 MED ORDER — TICAGRELOR 90 MG PO TABS: 90.0000 mg | ORAL_TABLET | Freq: Two times a day (BID) | ORAL | 3 refills | Status: AC

## 2016-10-04 MED ORDER — METOPROLOL TARTRATE 25 MG PO TABS: 25.0000 mg | ORAL_TABLET | Freq: Two times a day (BID) | ORAL | 3 refills | Status: AC

## 2016-10-04 MED ORDER — ATORVASTATIN CALCIUM 40 MG PO TABS: 40.0000 mg | ORAL_TABLET | Freq: Every day | ORAL | 3 refills | Status: AC

## 2016-10-04 NOTE — Progress Notes (Signed)
Cardiology Office Note   Date:  10/04/2016   ID:  Jesse Soto, DOB [birth year], MRN [id number]  PCP:  [facility]  Cardiologist:   Lorine Bears, MD   Chief Complaint  Patient presents with  . other    3 month follow up. Meds reviewed by the pt. verbally. "doing well."       History of Present Illness: Jesse Soto is a 61 y.o. male who presents for  a follow-up visit regarding coronary artery disease.  He has known history of hyperlipidemia, psoriasis and tobacco use. He presented in April 2017 with non-ST elevation myocardial infarction.  Cardiac catheterization showed 95% proximal RCA stenosis with very large thrombus, moderate mid RCA stenosis, occluded distal left circumflex supplying OM 3 territory with collaterals, occluded second diagonal and moderate mid LAD stenosis. EF was 45%. I performed successful aspiration thrombectomy and drug-eluting stent placement to the proximal right coronary artery. This was complicated by distal embolization into PL 1 and the patient was placed on Aggrastat infusion overnight. Echocardiogram after stent placement showed normal LV systolic function with no significant valvular abnormalities. He has recurrent chest pain as an outpatient . He underwent a treadmill nuclear stress test. The study was very suboptimal due to intense GI uptake but was suggestive of LAD ischemia. Thus, I proceeded with cardiac catheterization which showed patent RCA stent with unchanged disease. FFR interrogation was performed on the LAD and right coronary artery and both of them were nonsignificant. He was noted to have PVCs during the catheterization. A follow-up Holter monitor showed only mild amount of PVCs.  He has been doing extremely well and denies any chest pain or shortness of breath. He continues to be active and exercises regularly. He is still working full time. He only gets out of breath if he goes up 3 flights of stairs.   Past Medical  History:  Diagnosis Date  . Abdominal hernia   . Coronary artery disease 03/2016    non-ST elevation myocardial infarction. Cardiac catheterization showed 95% proximal RCA stenosis with very large thrombus, moderate mid RCA stenosis, occluded distal left circumflex supplying OM 3 territory with collaterals and moderate mid LAD stenosis. EF was 45%. Successful aspiration thrombectomy and drug-eluting stent placement to the proximal right coronary artery   . HLD (hyperlipidemia)   . Low back pain   . Psoriasis   . Tobacco abuse     Past Surgical History:  Procedure Laterality Date  . CARDIAC CATHETERIZATION N/A 04/02/2016   Procedure: Left Heart Cath;  Surgeon: Iran Ouch, MD;  Location: [facility];  Service: Cardiovascular;  Laterality: N/A;  . CARDIAC CATHETERIZATION N/A 04/02/2016   Procedure: Coronary Stent Intervention;  Surgeon: Iran Ouch, MD;  Location: [facility];  Service: Cardiovascular;  Laterality: N/A;  . CARDIAC CATHETERIZATION Left 05/17/2016   Procedure: Left Heart Cath and Coronary Angiography;  Surgeon: Iran Ouch, MD;  Location: [facility];  Service: Cardiovascular;  Laterality: Left;  . CARDIAC CATHETERIZATION N/A 05/17/2016   Procedure: Intravascular Pressure Wire/FFR Study;  Surgeon: Iran Ouch, MD;  Location: [facility];  Service: Cardiovascular;  Laterality: N/A;  . CHOLECYSTECTOMY    . HERNIA REPAIR    . KNEE SURGERY    . PERIPHERAL VASCULAR CATHETERIZATION  04/02/2016   Procedure: Coronary Angiogram ;  Surgeon: Iran Ouch, MD;  Location: [facility];  Service: Cardiovascular;;     Current Outpatient  Prescriptions  Medication Sig Dispense Refill  . aspirin 81 MG chewable tablet Chew 1 tablet (81 mg total) by mouth daily. 30 tablet 0  . atorvastatin (LIPITOR) 40 MG tablet Take 1 tablet (40 mg total) by mouth daily at 6 PM. 90 tablet 1  . cyclobenzaprine (FLEXERIL) 10 MG tablet Take 10 mg by  mouth at bedtime.     . metoprolol tartrate (LOPRESSOR) 25 MG tablet Take 1 tablet (25 mg total) by mouth 2 (two) times daily. 180 tablet 1  . nitroGLYCERIN (NITROSTAT) 0.4 MG SL tablet Place 1 tablet (0.4 mg total) under the tongue every 5 (five) minutes as needed for chest pain. 25 tablet 3  . pantoprazole (PROTONIX) 40 MG tablet Take 1 tablet (40 mg total) by mouth daily. 90 tablet 1  . ticagrelor (BRILINTA) 90 MG TABS tablet Take 1 tablet (90 mg total) by mouth 2 (two) times daily. 180 tablet 1  . traMADol (ULTRAM) 50 MG tablet Take 50 mg by mouth daily.      No current facility-administered medications for this visit.     Allergies:   Fluticasone and Imdur [isosorbide dinitrate]    Social History:  The patient  reports that he quit smoking about 6 months ago. His smoking use included Cigarettes. He has a 30.00 pack-year smoking history. He has never used smokeless tobacco. He reports that he drinks alcohol. He reports that he does not use drugs.   Family History:  The patient's Family history is remarkable for hypertension   ROS:  Please see the history of present illness.   Otherwise, review of systems are positive for none.   All other systems are reviewed and negative.    PHYSICAL EXAM: VS:  BP 118/68 (BP Location: Left Arm, Patient Position: Sitting, Cuff Size: Normal)   Pulse 67   Ht 5\' 9"  (1.753 m)   Wt 188 lb 12 oz (85.6 kg)   BMI 27.87 kg/m  , BMI Body mass index is 27.87 kg/m. GEN: Well nourished, well developed, in no acute distress HEENT: normal Neck: no JVD, carotid bruits, or masses Cardiac: RRR; no murmurs, rubs, or gallops,no edema  Respiratory:  clear to auscultation bilaterally, normal work of breathing GI: soft, nontender, nondistended, + BS MS: no deformity or atrophy Skin: warm and dry, no rash Neuro:  Strength and sensation are intact Psych: euthymic mood, full affect Right radial pulse is normal with no hematoma  EKG:  EKG is  ordered today. EKG  shows normal sinus rhythm with no significant ST or T wave changes.   Recent Labs: 04/01/2016: Magnesium 2.0 05/10/2016: ALT 13 05/16/2016: BUN 14; Creatinine, Ser 1.04; Hemoglobin 15.0; Platelets 209; Potassium 3.7; Sodium 137    Lipid Panel    Component Value Date/Time   CHOL 132 05/10/2016 0835   TRIG 108 05/10/2016 0835   HDL 33 (L) 05/10/2016 0835   CHOLHDL 4.0 05/10/2016 0835   CHOLHDL 6.4 04/01/2016 1649   VLDL 15 04/01/2016 1649   LDLCALC 77 05/10/2016 0835      Wt Readings from Last 3 Encounters:  10/04/16 188 lb 12 oz (85.6 kg)  07/05/16 185 lb 4 oz (84 kg)  06/11/16 184 lb 8 oz (83.7 kg)        ASSESSMENT AND PLAN:  1.  Coronary artery disease involving native coronary arteries Without angina:    He is doing extremely well with no anginal symptoms. Continue medical therapy.  2. PVCs: These resolved with metoprolol.  2. Hyperlipidemia:  Myalgia  improved after decreasing atorvastatin 40 mg daily. Most recent LDL was 77.  3. Previous tobacco use: No recurrent smoking since his cardiac event.   Disposition:   FU with me in 6 months  Signed,  Lorine BearsMuhammad Abimael Zeiter, MD  10/04/2016 10:59 AM    [facility]

## 2016-10-04 NOTE — Patient Instructions (Signed)
Medication Instructions:  Your physician recommends that you continue on your current medications as directed. Please refer to the Current Medication list given to you today.   Labwork: none  Testing/Procedures: none  Follow-Up: Your physician wants you to follow-up in: six months with Dr. Arida.  You will receive a reminder letter in the mail two months in advance. If you don't receive a letter, please call our office to schedule the follow-up appointment.   Any Other Special Instructions Will Be Listed Below (If Applicable).     If you need a refill on your cardiac medications before your next appointment, please call your pharmacy.   

## 2016-10-10 ENCOUNTER — Encounter: Payer: Self-pay | Admitting: *Deleted

## 2016-10-10 DIAGNOSIS — I214 Non-ST elevation (NSTEMI) myocardial infarction: Secondary | ICD-10-CM

## 2016-10-10 DIAGNOSIS — Z9861 Coronary angioplasty status: Secondary | ICD-10-CM

## 2016-10-10 NOTE — Progress Notes (Signed)
Cardiac Individual Treatment Plan  Patient Details  Name: Jesse Soto MRN: [id number] Date of Birth: Jun 29, 1955 Referring Provider:   Flowsheet Row Cardiac Rehab from 06/11/2016 in [facility] Cardiac and Pulmonary Rehab  Referring Provider  Kathlyn Sacramento MD      Initial Encounter Date:  Flowsheet Row Cardiac Rehab from 06/11/2016 in [facility] Cardiac and Pulmonary Rehab  Date  06/11/16  Referring Provider  Kathlyn Sacramento MD      Visit Diagnosis: NSTEMI (non-ST elevated myocardial infarction) ([place])  S/P PTCA (percutaneous transluminal coronary angioplasty)  Patient's Home Medications on Admission:  Current Outpatient Prescriptions:  .  aspirin 81 MG chewable tablet, Chew 1 tablet (81 mg total) by mouth daily., Disp: 30 tablet, Rfl: 0 .  atorvastatin (LIPITOR) 40 MG tablet, Take 1 tablet (40 mg total) by mouth daily at 6 PM., Disp: 90 tablet, Rfl: 3 .  cyclobenzaprine (FLEXERIL) 10 MG tablet, Take 10 mg by mouth at bedtime. , Disp: , Rfl:  .  metoprolol tartrate (LOPRESSOR) 25 MG tablet, Take 1 tablet (25 mg total) by mouth 2 (two) times daily., Disp: 180 tablet, Rfl: 3 .  nitroGLYCERIN (NITROSTAT) 0.4 MG SL tablet, Place 1 tablet (0.4 mg total) under the tongue every 5 (five) minutes as needed for chest pain., Disp: 25 tablet, Rfl: 3 .  pantoprazole (PROTONIX) 40 MG tablet, Take 1 tablet (40 mg total) by mouth daily., Disp: 90 tablet, Rfl: 3 .  ticagrelor (BRILINTA) 90 MG TABS tablet, Take 1 tablet (90 mg total) by mouth 2 (two) times daily., Disp: 180 tablet, Rfl: 3 .  traMADol (ULTRAM) 50 MG tablet, Take 50 mg by mouth daily. , Disp: , Rfl:   Past Medical History: Past Medical History:  Diagnosis Date  . Abdominal hernia   . Coronary artery disease 03/2016    non-ST elevation myocardial infarction. Cardiac catheterization showed 95% proximal RCA stenosis with very large thrombus, moderate mid RCA stenosis, occluded distal left circumflex supplying OM 3 territory with collaterals and  moderate mid LAD stenosis. EF was 45%. Successful aspiration thrombectomy and drug-eluting stent placement to the proximal right coronary artery   . HLD (hyperlipidemia)   . Low back pain   . Psoriasis   . Tobacco abuse     Tobacco Use: History  Smoking Status  . Former Smoker  . Packs/day: 1.00  . Years: 30.00  . Types: Cigarettes  . Quit date: 04/01/2016  Smokeless Tobacco  . Never Used    Labs: Recent Review Flowsheet Data    Labs for ITP Cardiac and Pulmonary Rehab Latest Ref Rng & Units 04/01/2016 05/10/2016   Cholestrol 100 - 199 mg/dL 199 132   LDLCALC 0 - 99 mg/dL 153(H) 77   HDL >39 mg/dL 31(L) 33(L)   Trlycerides 0 - 149 mg/dL 76 108   Hemoglobin A1c 4.0 - 6.0 % 5.7 -       Exercise Target Goals:    Exercise Program Goal: Individual exercise prescription set with THRR, safety & activity barriers. Participant demonstrates ability to understand and report RPE using BORG scale, to self-measure pulse accurately, and to acknowledge the importance of the exercise prescription.  Exercise Prescription Goal: Starting with aerobic activity 30 plus minutes a day, 3 days per week for initial exercise prescription. Provide home exercise prescription and guidelines that participant acknowledges understanding prior to discharge.  Activity Barriers & Risk Stratification:     Activity Barriers & Cardiac Risk Stratification - 06/11/16 1306      Activity Barriers &  Cardiac Risk Stratification   Activity Barriers --  Patient reports having had right knee cartilage removed while in the [employer].  States right knee "floats" requiring him to wear a brace.  As a result, this affects his lower back - two vertebrae are irritated due to his right knee problems.        6 Minute Walk:     6 Minute Walk    Row Name 06/11/16 1249         6 Minute Walk   Phase Initial     Distance 1300 feet     Walk Time 6 minutes     # of Rest Breaks 0     MPH 2.46     METS 3.65     RPE 13      Perceived Dyspnea  2     VO2 Peak 12.77     Symptoms No     Resting HR 47 bpm     Resting BP 122/70     Max Ex. HR 113 bpm     Max Ex. BP 132/68        Initial Exercise Prescription:     Initial Exercise Prescription - 06/11/16 1200      Date of Initial Exercise RX and Referring Provider   Date 06/11/16   Referring Provider Kathlyn Sacramento MD     Treadmill   MPH 2.2   Grade 1   Minutes 15   METs 2.99     NuStep   Level 3   Watts 30   Minutes 15   METs 2     Recumbant Elliptical   Level 3   RPM 50   Watts 30   Minutes 15   METs 2     REL-XR   Level 2   Watts 30   Minutes 15   METs 2     T5 Nustep   Level 2   Watts 30   Minutes 15   METs 2     Biostep-RELP   Level 3   Watts 30   Minutes 15   METs 2     Prescription Details   Frequency (times per week) 3   Duration Progress to 30 minutes of continuous aerobic without signs/symptoms of physical distress     Intensity   THRR 40-80% of Max Heartrate 93-138   Ratings of Perceived Exertion 11-13   Perceived Dyspnea 0-4     Progression   Progression Continue to progress workloads to maintain intensity without signs/symptoms of physical distress.     Resistance Training   Training Prescription Yes   Weight 2   Reps 10-12      Perform Capillary Blood Glucose checks as needed.  Exercise Prescription Changes:     Exercise Prescription Changes    Row Name 06/15/16 1000 06/29/16 1000 07/26/16 1400 08/20/16 1700 08/23/16 1300     Exercise Review   Progression _0      Response to Exercise   Blood Pressure (Admit) 134/80 124/64 104/66  - 132/60   Blood Pressure (Exercise) 148/72 138/68 132/68  - 140/76   Blood Pressure (Exit) 134/68 110/60 124/72  - 128/74   Heart Rate (Admit) 66 bpm 84 bpm 81 bpm  - 80 bpm   Heart Rate (Exercise) 81 bpm 109 bpm 100 bpm  - 108 bpm   Heart Rate (Exit) 63 bpm 66 bpm 72 bpm  - 89 bpm   Rating of Perceived Exertion (Exercise) 15  13 14  - 13    Symptoms  -  - none  -  -   Duration  -  - Progress to 45 minutes of aerobic exercise without signs/symptoms of physical distress Progress to 45 minutes of aerobic exercise without signs/symptoms of physical distress Progress to 45 minutes of aerobic exercise without signs/symptoms of physical distress   Intensity  -  - THRR unchanged THRR unchanged THRR unchanged     Progression   Average METs  -  -  -  - 3.3     Resistance Training   Training Prescription Yes Yes Yes Yes  -   Weight _0 -   Reps 10-12 10-15 10-15 10-15  -     Treadmill   MPH 1.9 2.5  -  - 3   Grade 0 0  -  - 0   Minutes 8 20  -  - 25   METs 2.45 2.9  -  - 3.3     NuStep   Level 4  -  -  -  -   Watts 24  -  -  -  -   Minutes 20  -  -  -  -   METs 2  -  -  -  -     REL-XR   Level 1  -  -  -  -   Watts 69  -  -  -  -   Minutes 10  -  -  -  -   METs 3.8  -  -  -  -     T5 Nustep   Level  -  - 4 4  -   Minutes  -  - 20 20  -     Home Exercise Plan   Plans to continue exercise at  -  -  - Home  Plans to walk at work   -   Frequency  -  -  - Add 2 additional days to program exercise sessions.  -      Exercise Comments:     Exercise Comments    Row Name 06/11/16 1255 06/13/16 1743 06/15/16 1040 07/12/16 1359 08/09/16 1531   Exercise Comments Wears knee brace for extra support.  He would like to increase his stamina while here in program. First full day of exercise!  Patient was oriented to gym and equipment including functions, settings, policies, and procedures.  Patient's individual exercise prescription and treatment plan were reviewed.  All starting workloads were established based on the results of the 6 minute walk test done at initial orientation visit.  The plan for exercise progression was also introduced and progression will be customized based on patient's performance and goals. Karder has completed 2 days of exercise and has tolerated sessions well. Valerie's last session was 7/3 and no new  changes have been made to his exercise program. Ramses has not attended since 07/02/16.   [place] Name 08/20/16 1753 08/23/16 1319 09/06/16 1120 09/19/16 1555     Exercise Comments Reviewed home exercise with pt today.  Pt plans to walk at his workplace for exercise.  Reviewed THR, pulse, RPE, sign and symptoms, NTG use, and when to call 911 or MD.  Also discussed weather considerations and indoor options.  Pt voiced understanding. Mr Ashland is progressing well with exercise and has added in using the TM at work.  His knee has bothered him at times. Hilery has not attended  since 08/22/16.  He works in [place] and has trouble getting here since he has started back to work. Mr Bernards has not attended [facility] inseveral weeks - he started back to work and cannot get here on time.       Discharge Exercise Prescription (Final Exercise Prescription Changes):     Exercise Prescription Changes - 08/23/16 1300      Exercise Review   Progression Yes     Response to Exercise   Blood Pressure (Admit) 132/60   Blood Pressure (Exercise) 140/76   Blood Pressure (Exit) 128/74   Heart Rate (Admit) 80 bpm   Heart Rate (Exercise) 108 bpm   Heart Rate (Exit) 89 bpm   Rating of Perceived Exertion (Exercise) 13   Duration Progress to 45 minutes of aerobic exercise without signs/symptoms of physical distress   Intensity THRR unchanged     Progression   Average METs 3.3     Treadmill   MPH 3   Grade 0   Minutes 25   METs 3.3      Nutrition:  Target Goals: Understanding of nutrition guidelines, daily intake of sodium <1572m, cholesterol <2050m calories 30% from fat and 7% or less from saturated fats, daily to have 5 or more servings of fruits and vegetables.  Biometrics:     Pre Biometrics - 06/11/16 1253      Pre Biometrics   Height 5' 8.5" (1.74 m)   Weight 184 lb 8 oz (83.7 kg)   Waist Circumference 40 inches   Hip Circumference 40 inches   Waist to Hip Ratio 1 %   BMI (Calculated) 27.7        Nutrition Therapy Plan and Nutrition Goals:     Nutrition Therapy & Goals - 06/29/16 1211      Nutrition Therapy   Diet Instructed on a heart healthy diet  based on 1900 calories and DASH diet principles.   Drug/Food Interactions Statins/Certain Fruits   Protein (specify units) 8   Fiber 30 grams   Whole Grain Foods 3 servings   Saturated Fats 13 max. grams   Fruits and Vegetables 5 servings/day   Sodium 2000 grams  [id number]     Personal Nutrition Goals   Personal Goal #1 Read labels for saturated fat, trans fat and sodium.   Personal Goal #2 Include a vegetable or fruit or preferably both at meals.   Personal Goal #3 Include more whole grain servings. Refer to list.     Intervention Plan   Intervention Prescribe, educate and counsel regarding individualized specific dietary modifications aiming towards targeted core components such as weight, hypertension, lipid management, diabetes, heart failure and other comorbidities.;Nutrition handout(s) given to patient.   Expected Outcomes Short Term Goal: Understand basic principles of dietary content, such as calories, fat, sodium, cholesterol and nutrients.;Short Term Goal: A plan has been developed with personal nutrition goals set during dietitian appointment.;Long Term Goal: Adherence to prescribed nutrition plan.      Nutrition Discharge: Rate Your Plate Scores:     Nutrition Assessments - 06/29/16 1215      Rate Your Plate Scores   Pre Score 56   Pre Score % 62 %      Nutrition Goals Re-Evaluation:   Psychosocial: Target Goals: Acknowledge presence or absence of depression, maximize coping skills, provide positive support system. Participant is able to verbalize types and ability to use techniques and skills needed for reducing stress and depression.  Initial Review & Psychosocial Screening:  Initial Psych Review & Screening - 06/11/16 1321      Initial Review   Current issues with --  Daishon denies  depression or history of depression.       Family Dynamics   Good Support System? Yes  Staton states his wife Larene Beach is very supportive.  They have 2 children and no grandchildren.       Barriers   Psychosocial barriers to participate in program There are no identifiable barriers or psychosocial needs.     Screening Interventions   Interventions Encouraged to exercise;Program counselor consult      Quality of Life Scores:     Quality of Life - 06/11/16 1323      Quality of Life Scores   Health/Function Pre 21.2 %   Socioeconomic Pre 26.25 %   Psych/Spiritual Pre 30 %   Family Pre 27.6 %   GLOBAL Pre 25.03 %      PHQ-9: Recent Review Flowsheet Data    Depression screen [facility] 2/9 06/11/2016   Decreased Interest 0   Down, Depressed, Hopeless 0   PHQ - 2 Score 0   Altered sleeping 0   Tired, decreased energy 1   Change in appetite 0   Feeling bad or failure about yourself  0   Trouble concentrating 0   Moving slowly or fidgety/restless 1   Suicidal thoughts 0   PHQ-9 Score 2   Difficult doing work/chores Somewhat difficult      Psychosocial Evaluation and Intervention:     Psychosocial Evaluation - 06/13/16 1719      Psychosocial Evaluation & Interventions   Interventions Encouraged to exercise with the program and follow exercise prescription;Stress management education;Relaxation education   Comments Counselor met with Mr. Kiernan today for initial psychosocial evaluation.  He is 61 years old today and is in this program subsequent to a stent insertion in April.  He has a strong support system with a spouse of 15 years and a set of twin daughters.  He also is involved in his local church at times.  Mr. Sainvil has a bad knee which has prohibited working out consistently in the past.  He states that he is sleeping better now and his appetite has improved for healthier options.  Mr. Maceachern denies a history of depression or anxiety, but reported since taking some of the  newly prescribed medications he has noticed he is more easily irritated and angry.  He states that one of the medications causes a dull headache which contributes to his current mood.  Mr. Capelli states he has spoken to his doctor about this without much success.  He goes back to work on 7/8 and states his job can be stressful at times.  Mr. Sinkfield was very short of breath today but was committed to the exercise program.   Counselor will follow with him throughout his time in Cardiac Rehab.   Continued Psychosocial Services Needed Yes  Mr. Zufall will benefit from the psychoeducational components of this program - especially stress management and relaxation.       Psychosocial Re-Evaluation:     Psychosocial Re-Evaluation    [place] Name 06/20/16 1721             Psychosocial Re-Evaluation   Comments Counselor follow up with Mr. Beswick today reporting this program is "just what I needed."  He is experiencing fewer negative symptoms and already has more energy since beginning.  He was also breathing better today on the treadmill and stated  his headaches are not as severe now.  Counselor commended him on his commitment to improved health and consistent exercise.            Vocational Rehabilitation: Provide vocational rehab assistance to qualifying candidates.   Vocational Rehab Evaluation & Intervention:     Vocational Rehab - 06/11/16 1313      Initial Vocational Rehab Evaluation & Intervention   Assessment shows need for Vocational Rehabilitation No      Education: Education Goals: Education classes will be provided on a weekly basis, covering required topics. Participant will state understanding/return demonstration of topics presented.  Learning Barriers/Preferences:     Learning Barriers/Preferences - 06/11/16 1309      Learning Barriers/Preferences   Learning Barriers Sight;Exercise Concerns  Patient reports vision has changed since MI.  Seeing Ophthalmologist on June  15th, 2017.     Learning Preferences None      Education Topics: General Nutrition Guidelines/Fats and Fiber: -Group instruction provided by verbal, written material, models and posters to present the general guidelines for heart healthy nutrition. Gives an explanation and review of dietary fats and fiber.   Controlling Sodium/Reading Food Labels: -Group verbal and written material supporting the discussion of sodium use in heart healthy nutrition. Review and explanation with models, verbal and written materials for utilization of the food label.   Exercise Physiology & Risk Factors: - Group verbal and written instruction with models to review the exercise physiology of the cardiovascular system and associated critical values. Details cardiovascular disease risk factors and the goals associated with each risk factor. Flowsheet Row Cardiac Rehab from 06/27/2016 in [facility] Cardiac and Pulmonary Rehab  Date  06/13/16  Educator  AS  Instruction Review Code  2- meets goals/outcomes      Aerobic Exercise & Resistance Training: - Gives group verbal and written discussion on the health impact of inactivity. On the components of aerobic and resistive training programs and the benefits of this training and how to safely progress through these programs.   Flexibility, Balance, General Exercise Guidelines: - Provides group verbal and written instruction on the benefits of flexibility and balance training programs. Provides general exercise guidelines with specific guidelines to those with heart or lung disease. Demonstration and skill practice provided. Flowsheet Row Cardiac Rehab from 06/27/2016 in [facility] Cardiac and Pulmonary Rehab  Date  06/20/16  Educator  AS  Instruction Review Code  2- meets goals/outcomes      Stress Management: - Provides group verbal and written instruction about the health risks of elevated stress, cause of high stress, and healthy ways to reduce stress. Flowsheet Row  Cardiac Rehab from 06/27/2016 in [facility] Cardiac and Pulmonary Rehab  Date  06/27/16  Educator  Kathreen Cornfield, [facility]  Instruction Review Code  2- meets goals/outcomes      Depression: - Provides group verbal and written instruction on the correlation between heart/lung disease and depressed mood, treatment options, and the stigmas associated with seeking treatment.   Anatomy & Physiology of the Heart: - Group verbal and written instruction and models provide basic cardiac anatomy and physiology, with the coronary electrical and arterial systems. Review of: AMI, Angina, Valve disease, Heart Failure, Cardiac Arrhythmia, Pacemakers, and the ICD.   Cardiac Procedures: - Group verbal and written instruction and models to describe the testing methods done to diagnose heart disease. Reviews the outcomes of the test results. Describes the treatment choices: Medical Management, Angioplasty, or Coronary Bypass Surgery.   Cardiac Medications: - Group verbal and written instruction  to review commonly prescribed medications for heart disease. Reviews the medication, class of the drug, and side effects. Includes the steps to properly store meds and maintain the prescription regimen.   Go Sex-Intimacy & Heart Disease, Get SMART - Goal Setting: - Group verbal and written instruction through game format to discuss heart disease and the return to sexual intimacy. Provides group verbal and written material to discuss and apply goal setting through the application of the S.M.A.R.T. Method.   Other Matters of the Heart: - Provides group verbal, written materials and models to describe Heart Failure, Angina, Valve Disease, and Diabetes in the realm of heart disease. Includes description of the disease process and treatment options available to the cardiac patient.   Exercise & Equipment Safety: - Individual verbal instruction and demonstration of equipment use and safety with use of the equipment. Flowsheet Row  Cardiac Rehab from 06/11/2016 in [facility] Cardiac and Pulmonary Rehab  Date  06/11/16  Educator  D.W.  Instruction Review Code  1- partially meets, needs review/practice      Infection Prevention: - Provides verbal and written material to individual with discussion of infection control including proper hand washing and proper equipment cleaning during exercise session. Flowsheet Row Cardiac Rehab from 06/11/2016 in [facility] Cardiac and Pulmonary Rehab  Date  06/11/16  Educator  DW  Instruction Review Code  2- meets goals/outcomes      Falls Prevention: - Provides verbal and written material to individual with discussion of falls prevention and safety. Flowsheet Row Cardiac Rehab from 06/11/2016 in [facility] Cardiac and Pulmonary Rehab  Date  06/11/16  Educator  DW  Instruction Review Code  2- meets goals/outcomes      Diabetes: - Individual verbal and written instruction to review signs/symptoms of diabetes, desired ranges of glucose level fasting, after meals and with exercise. Advice that pre and post exercise glucose checks will be done for 3 sessions at entry of program.    Knowledge Questionnaire Score:     Knowledge Questionnaire Score - 06/11/16 1313      Knowledge Questionnaire Score   Pre Score 21/28      Core Components/Risk Factors/Patient Goals at Admission:     Personal Goals and Risk Factors at Admission - 06/11/16 1316      Core Components/Risk Factors/Patient Goals on Admission    Weight Management Yes;Weight Maintenance   Intervention Weight Management: Provide education and appropriate resources to help participant work on and attain dietary goals.;Weight Management: Develop a combined nutrition and exercise program designed to reach desired caloric intake, while maintaining appropriate intake of nutrient and fiber, sodium and fats, and appropriate energy expenditure required for the weight goal.   Admit Weight 184 lb 8 oz (83.7 kg)   Goal Weight: Short Term 184 lb  8 oz (83.7 kg)   Goal Weight: Long Term 184 lb 8 oz (83.7 kg)   Expected Outcomes Weight Maintenance: Understanding of the daily nutrition guidelines, which includes 25-35% calories from fat, 7% or less cal from saturated fats, less than 227m cholesterol, less than 1.5gm of sodium, & 5 or more servings of fruits and vegetables daily   Sedentary Yes   Intervention Provide advice, education, support and counseling about physical activity/exercise needs.;Develop an individualized exercise prescription for aerobic and resistive training based on initial evaluation findings, risk stratification, comorbidities and participant's personal goals.   Expected Outcomes Achievement of increased cardiorespiratory fitness and enhanced flexibility, muscular endurance and strength shown through measurements of functional capacity and personal statement of  participant.   Increase Strength and Stamina Yes   Intervention Provide advice, education, support and counseling about physical activity/exercise needs.;Develop an individualized exercise prescription for aerobic and resistive training based on initial evaluation findings, risk stratification, comorbidities and participant's personal goals.   Expected Outcomes Achievement of increased cardiorespiratory fitness and enhanced flexibility, muscular endurance and strength shown through measurements of functional capacity and personal statement of participant.   Hypertension Yes   Intervention Provide education on lifestyle modifcations including regular physical activity/exercise, weight management, moderate sodium restriction and increased consumption of fresh fruit, vegetables, and low fat dairy, alcohol moderation, and smoking cessation.;Monitor prescription use compliance.   Expected Outcomes Short Term: Continued assessment and intervention until BP is < 140/93m HG in hypertensive participants. < 130/828mHG in hypertensive participants with diabetes, heart failure or  chronic kidney disease.;Long Term: Maintenance of blood pressure at goal levels.   Lipids Yes   Intervention Provide education and support for participant on nutrition & aerobic/resistive exercise along with prescribed medications to achieve LDL <7076mHDL >11m21m Expected Outcomes Short Term: Participant states understanding of desired cholesterol values and is compliant with medications prescribed. Participant is following exercise prescription and nutrition guidelines.;Long Term: Cholesterol controlled with medications as prescribed, with individualized exercise RX and with personalized nutrition plan. Value goals: LDL < 70mg54mL > 40 mg.      Core Components/Risk Factors/Patient Goals Review:      Goals and Risk Factor Review    Row Name 06/28/16 1333             Core Components/Risk Factors/Patient Goals Review   Personal Goals Review Increase Strength and [place]am[place]       Review RickyDevianed he is feeling stronger since begnning Heart Track.       Expected Outcomes RickyJahaziel continue to be consistent with exercise and see improvement in overall function.          Core Components/Risk Factors/Patient Goals at Discharge (Final Review):      Goals and Risk Factor Review - 06/28/16 1333      Core Components/Risk Factors/Patient Goals Review   Personal Goals Review Increase Strength and Stamina   Review RickyKimothyed he is feeling stronger since begnning Heart Track.   Expected Outcomes RickyRaliegh continue to be consistent with exercise and see improvement in overall function.      ITP Comments:     ITP Comments    Row Name 06/20/16 0927 06/20/16 1830 06/20/16 1831 06/25/16 1902 07/18/16 0802   ITP Comments 30 day review.  Continue with ITP  New to program Jeric informed staff in Cardiac Rehab today that he had stopped taking his Imdur 30 mg Ustin informed staff in Cardiac Rehab today that he had stopped taking his Imdur 30 mg once a day because he was having a constant  headache.  He stated he has not taken it for the past three days.  He did not take it on Monday [place] [facility] [place] and he just could not drive there with a headache.  He states his SOB has improved and he has not had any chest pain.  He was able to do 5 minutes warm up on treadmill then 15 minutes on Treadmill at 2.5 mph.  Will contact Dr. AridaTyrell Antonioe to inform MD that patient has stopped taking Imdur.  No cardiac symptoms reported while exercising.   RickyDELLA HOMANnot complete his rehab  session.  Tayvon presented to Cardiac Rehab today at 4 p.m. As scheduled.  He stated he did not think he was going to be able to exercise, as he was having left groin pain.  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  He stated he took him over 15 minutes to walk from his car to get to the rehab gym.  Nurse agreed that he would not be able to exercise.  Nurse instructed him to get evaluated by MD prior to returning to cardiac rehab.  Nurse asked patient if he needed assistance getting to his car.  He refused offer of assistance in getting to his car.   30 day review. Continue with ITP   Row Name 07/26/16 1421 08/15/16 0831 08/23/16 1320 09/06/16 1121 09/12/16 0704   ITP Comments Mr Dimmick hasnt attended since 7/3.  LMOM about returning to class. 30 day review. Continue with ITP unless changes noted by Medical Director at signature of review. Mr Vandermeulen is progressing well with exercise and has added in using the TM at work.  His knee has bothered him at times. Athanasios has not attended since 08/22/16.  He works in [place] and has trouble getting here since he has started back to work 30 day review. Continue with ITP unless changes noted by Medical Director at signature of review.   [place] Name 10/10/16 0703           ITP Comments 30 day review. Continue with ITP unless changes noted by Medical Director at signature of review.  HAs been absent           Comments:

## 2016-10-18 ENCOUNTER — Telehealth: Payer: Self-pay

## 2016-10-18 NOTE — Telephone Encounter (Signed)
LMOM

## 2016-10-30 ENCOUNTER — Emergency Department (HOSPITAL_COMMUNITY)
Admission: EM | Admit: 2016-10-30 | Discharge: 2016-10-30 | Disposition: A | Discharge status: Home / Self Care | Payer: Managed Care, Other (non HMO) | Attending: Emergency Medicine | Admitting: Emergency Medicine

## 2016-10-30 ENCOUNTER — Emergency Department (HOSPITAL_COMMUNITY): Payer: Managed Care, Other (non HMO)

## 2016-10-30 ENCOUNTER — Encounter (HOSPITAL_COMMUNITY): Payer: Self-pay | Admitting: Emergency Medicine

## 2016-10-30 DIAGNOSIS — Z7984 Long term (current) use of oral hypoglycemic drugs: Secondary | ICD-10-CM | POA: Diagnosis not present

## 2016-10-30 DIAGNOSIS — I10 Essential (primary) hypertension: Secondary | ICD-10-CM | POA: Insufficient documentation

## 2016-10-30 DIAGNOSIS — Z79899 Other long term (current) drug therapy: Secondary | ICD-10-CM | POA: Diagnosis not present

## 2016-10-30 DIAGNOSIS — I252 Old myocardial infarction: Secondary | ICD-10-CM | POA: Insufficient documentation

## 2016-10-30 DIAGNOSIS — Z87891 Personal history of nicotine dependence: Secondary | ICD-10-CM | POA: Diagnosis not present

## 2016-10-30 DIAGNOSIS — R10816 Epigastric abdominal tenderness: Secondary | ICD-10-CM | POA: Diagnosis not present

## 2016-10-30 DIAGNOSIS — R1013 Epigastric pain: Secondary | ICD-10-CM | POA: Diagnosis present

## 2016-10-30 DIAGNOSIS — I2510 Atherosclerotic heart disease of native coronary artery without angina pectoris: Secondary | ICD-10-CM | POA: Insufficient documentation

## 2016-10-30 DIAGNOSIS — E119 Type 2 diabetes mellitus without complications: Secondary | ICD-10-CM | POA: Diagnosis not present

## 2016-10-30 DIAGNOSIS — Z7982 Long term (current) use of aspirin: Secondary | ICD-10-CM | POA: Insufficient documentation

## 2016-10-30 DIAGNOSIS — E139 Other specified diabetes mellitus without complications: Secondary | ICD-10-CM

## 2016-10-30 LAB — CBC WITH DIFFERENTIAL/PLATELET
BASOS ABS: 0 10*3/uL (ref 0.0–0.1)
Basophils Relative: 0 %
EOS PCT: 1 %
Eosinophils Absolute: 0.1 10*3/uL (ref 0.0–0.7)
HEMATOCRIT: 43.5 % (ref 39.0–52.0)
Hemoglobin: 15.8 g/dL (ref 13.0–17.0)
LYMPHS ABS: 1.1 10*3/uL (ref 0.7–4.0)
LYMPHS PCT: 13 %
MCH: 30.6 pg (ref 26.0–34.0)
MCHC: 36.3 g/dL — ABNORMAL HIGH (ref 30.0–36.0)
MCV: 84.3 fL (ref 78.0–100.0)
Monocytes Absolute: 0.6 10*3/uL (ref 0.1–1.0)
Monocytes Relative: 7 %
NEUTROS ABS: 6.7 10*3/uL (ref 1.7–7.7)
Neutrophils Relative %: 79 %
PLATELETS: 188 10*3/uL (ref 150–400)
RBC: 5.16 MIL/uL (ref 4.22–5.81)
RDW: 13.3 % (ref 11.5–15.5)
WBC: 8.5 10*3/uL (ref 4.0–10.5)

## 2016-10-30 LAB — I-STAT VENOUS BLOOD GAS, ED
ACID-BASE DEFICIT: 4 mmol/L — AB (ref 0.0–2.0)
Bicarbonate: 19.8 mmol/L — ABNORMAL LOW (ref 20.0–28.0)
O2 SAT: 87 %
PCO2 VEN: 33.6 mmHg — AB (ref 44.0–60.0)
TCO2: 21 mmol/L (ref 0–100)
pH, Ven: 7.379 (ref 7.250–7.430)
pO2, Ven: 53 mmHg — ABNORMAL HIGH (ref 32.0–45.0)

## 2016-10-30 LAB — COMPREHENSIVE METABOLIC PANEL
ALT: 34 U/L (ref 17–63)
AST: 47 U/L — ABNORMAL HIGH (ref 15–41)
Albumin: 4.2 g/dL (ref 3.5–5.0)
Alkaline Phosphatase: 80 U/L (ref 38–126)
Anion gap: 9 (ref 5–15)
BUN: 15 mg/dL (ref 6–20)
CO2: 21 mmol/L — ABNORMAL LOW (ref 22–32)
Calcium: 9.5 mg/dL (ref 8.9–10.3)
Chloride: 106 mmol/L (ref 101–111)
Creatinine, Ser: 1.04 mg/dL (ref 0.61–1.24)
GFR calc Af Amer: >60 mL/min (ref >60)
GFR calc non Af Amer: >60 mL/min (ref >60)
Glucose, Bld: 385 mg/dL — ABNORMAL HIGH (ref 65–99)
Potassium: 4.4 mmol/L (ref 3.5–5.1)
Sodium: 136 mmol/L (ref 135–145)
Total Bilirubin: 1.1 mg/dL (ref 0.3–1.2)
Total Protein: 7.6 g/dL (ref 6.5–8.1)

## 2016-10-30 LAB — TROPONIN I: Troponin I: <0.03 ng/mL (ref <0.03)

## 2016-10-30 MED ORDER — IOPAMIDOL (ISOVUE-300) INJECTION 61%
INTRAVENOUS | Status: AC
  Administered 2016-10-30: 75 mL via INTRAVENOUS
  Filled 2016-10-30: qty 100

## 2016-10-30 MED ORDER — SODIUM CHLORIDE 0.9 % IV BOLUS (SEPSIS)
1000.0000 mL | Freq: Once | INTRAVENOUS | Status: AC
  Administered 2016-10-30: 1000 mL via INTRAVENOUS

## 2016-10-30 MED ORDER — METFORMIN HCL 500 MG PO TABS: 500.0000 mg | ORAL_TABLET | Freq: Two times a day (BID) | ORAL | 0 refills | Status: AC

## 2016-10-30 MED ORDER — METFORMIN HCL 500 MG PO TABS
500.0000 mg | ORAL_TABLET | Freq: Once | ORAL | Status: AC
  Administered 2016-10-30: 500 mg via ORAL
  Filled 2016-10-30: qty 1

## 2016-10-30 NOTE — ED Triage Notes (Signed)
[facility]/o stabbing abdominal pain starting 1 hour ago. Patient had constipation yesterday, took a laxative last night, and had formed stool this AM with some straining. Patient has hx of abd hernia. EMS v/s 128/72, 97% on RA, 18 RR, and 92 HR. EMS fsbs 346. Patient has no hx of DM but does have family hx of DM. Patient also c/o frequent urination, fatigue, thirst, visual changes, and leg spasms. EMS EKG unremarkable. Hx of cath placement in April. No complaints of chest pain today.

## 2016-10-30 NOTE — ED Provider Notes (Signed)
[facility] DEPT Provider Note   CSN: [id number] Arrival date & time: 10/30/16  1153     History   Chief Complaint Chief Complaint  Patient presents with  . Abdominal Pain    HPI Jesse Soto is a 61 y.o. male.  The history is provided by the patient and a relative.  Abdominal Pain   This is a new problem. The current episode started 3 to 5 hours ago. The problem occurs constantly. The problem has not changed since onset.The pain is located in the epigastric region. The pain is severe. Associated symptoms include frequency. Pertinent negatives include fever. Past workup does not include GI consult or CT scan. His past medical history does not include ulcerative colitis or Crohn's disease.    Past Medical History:  Diagnosis Date  . Abdominal hernia   . Coronary artery disease 03/2016    non-ST elevation myocardial infarction. Cardiac catheterization showed 95% proximal RCA stenosis with very large thrombus, moderate mid RCA stenosis, occluded distal left circumflex supplying OM 3 territory with collaterals and moderate mid LAD stenosis. EF was 45%. Successful aspiration thrombectomy and drug-eluting stent placement to the proximal right coronary artery   . HLD (hyperlipidemia)   . Low back pain   . Psoriasis   . Tobacco abuse     Patient Active Problem List   Diagnosis Date Noted  . Coronary artery disease involving native coronary artery with angina pectoris (HCC) 06/08/2016  . Effort angina (HCC)   . Chest pain   . Acute MI, subendocardial, initial episode of care (HCC) 04/01/2016  . HTN (hypertension) 04/01/2016  . Subendocardial MI first episode care (HCC) 04/01/2016  . Coronary artery disease 03/31/2016    Past Surgical History:  Procedure Laterality Date  . CARDIAC CATHETERIZATION N/A 04/02/2016   Procedure: Left Heart Cath;  Surgeon: Iran Ouch, MD;  Location: [facility] INVASIVE CV LAB;  Service: Cardiovascular;  Laterality: N/A;  . CARDIAC  CATHETERIZATION N/A 04/02/2016   Procedure: Coronary Stent Intervention;  Surgeon: Iran Ouch, MD;  Location: [facility] INVASIVE CV LAB;  Service: Cardiovascular;  Laterality: N/A;  . CARDIAC CATHETERIZATION Left 05/17/2016   Procedure: Left Heart Cath and Coronary Angiography;  Surgeon: Iran Ouch, MD;  Location: [facility] INVASIVE CV LAB;  Service: Cardiovascular;  Laterality: Left;  . CARDIAC CATHETERIZATION N/A 05/17/2016   Procedure: Intravascular Pressure Wire/FFR Study;  Surgeon: Iran Ouch, MD;  Location: [facility] INVASIVE CV LAB;  Service: Cardiovascular;  Laterality: N/A;  . CHOLECYSTECTOMY    . HERNIA REPAIR    . KNEE SURGERY    . PERIPHERAL VASCULAR CATHETERIZATION  04/02/2016   Procedure: Coronary Angiogram ;  Surgeon: Iran Ouch, MD;  Location: [facility] INVASIVE CV LAB;  Service: Cardiovascular;;       Home Medications    Prior to Admission medications   Medication Sig Start Date End Date Taking? Authorizing Provider  aspirin 81 MG chewable tablet Chew 1 tablet (81 mg total) by mouth daily. 04/04/16  Yes Altamese Dilling, MD  atorvastatin (LIPITOR) 40 MG tablet Take 1 tablet (40 mg total) by mouth daily at 6 PM. 10/04/16  Yes Iran Ouch, MD  cyclobenzaprine (FLEXERIL) 10 MG tablet Take 10 mg by mouth at bedtime.  09/14/14  Yes Historical Provider, MD  metoprolol tartrate (LOPRESSOR) 25 MG tablet Take 1 tablet (25 mg total) by mouth 2 (two) times daily. 10/04/16  Yes Iran Ouch, MD  nitroGLYCERIN (NITROSTAT) 0.4 MG SL tablet Place 1 tablet (0.4 mg  total) under the tongue every 5 (five) minutes as needed for chest pain. 04/12/16  Yes Iran OuchMuhammad A Arida, MD  pantoprazole (PROTONIX) 40 MG tablet Take 1 tablet (40 mg total) by mouth daily. 10/04/16  Yes Iran OuchMuhammad A Arida, MD  ticagrelor (BRILINTA) 90 MG TABS tablet Take 1 tablet (90 mg total) by mouth 2 (two) times daily. 10/04/16  Yes Iran OuchMuhammad A Arida, MD  traMADol (ULTRAM) 50 MG tablet Take 50 mg by mouth daily.    Yes  Historical Provider, MD  metFORMIN (GLUCOPHAGE) 500 MG tablet Take 1 tablet (500 mg total) by mouth 2 (two) times daily with a meal. 10/30/16   Marily MemosJason Heith Haigler, MD    Family History Family History  Problem Relation Age of Onset  . Hypertension      Social History Social History  Substance Use Topics  . Smoking status: Former Smoker    Packs/day: 1.00    Years: 30.00    Types: Cigarettes    Quit date: 04/01/2016  . Smokeless tobacco: Never Used  . Alcohol use Yes     Comment: occasional use     Allergies   Fluticasone and Imdur [isosorbide dinitrate]   Review of Systems Review of Systems  Constitutional: Negative for fever.  Gastrointestinal: Positive for abdominal pain.  Genitourinary: Positive for frequency.  All other systems reviewed and are negative.    Physical Exam Updated Vital Signs BP 114/63   Pulse (!) 57   Temp 97.7 F (36.5 C) (Oral)   Resp 15   SpO2 97%   Physical Exam  Constitutional: He is oriented to person, place, and time. He appears well-developed and well-nourished.  HENT:  Head: Normocephalic and atraumatic.  Eyes: Conjunctivae and EOM are normal.  Neck: Normal range of motion.  Cardiovascular: Normal rate.   Pulmonary/Chest: Effort normal. No respiratory distress. He has no wheezes.  Abdominal: Soft. He exhibits no distension. There is tenderness (epigastric area).  Musculoskeletal: Normal range of motion. He exhibits no deformity.  Neurological: He is alert and oriented to person, place, and time.  Skin: Skin is warm and dry.  Nursing note and vitals reviewed.    ED Treatments / Results  Labs (all labs ordered are listed, but only abnormal results are displayed) Labs Reviewed  CBC WITH DIFFERENTIAL/PLATELET - Abnormal; Notable for the following:       Result Value   MCHC 36.3 (*)    All other components within normal limits  COMPREHENSIVE METABOLIC PANEL - Abnormal; Notable for the following:    CO2 21 (*)    Glucose, Bld 385  (*)    AST 47 (*)    All other components within normal limits  I-STAT VENOUS BLOOD GAS, ED - Abnormal; Notable for the following:    pCO2, Ven 33.6 (*)    pO2, Ven 53.0 (*)    Bicarbonate 19.8 (*)    Acid-base deficit 4.0 (*)    All other components within normal limits  TROPONIN I  BLOOD GAS, VENOUS  URINALYSIS, ROUTINE W REFLEX MICROSCOPIC (NOT AT [facility])    EKG  EKG Interpretation  Date/Time:  Tuesday October 30 2016 12:43:23 EDT Ventricular Rate:  64 PR Interval:    QRS Duration: 90 QT Interval:  401 QTC Calculation: 414 R Axis:   25 Text Interpretation:  Sinus rhythm Low voltage, precordial leads improving ST changes from april 2017 Confirmed by [facility] MD, Barbara CowerJASON [phone]) on 10/30/2016 2:02:55 PM       Radiology Ct Abdomen Pelvis W Contrast  Result Date: 10/30/2016 CLINICAL DATA:  61 year old male with lower abdominal pain since this morning. Initial encounter. EXAM: CT ABDOMEN AND PELVIS WITH CONTRAST TECHNIQUE: Multidetector CT imaging of the abdomen and pelvis was performed using the standard protocol following bolus administration of intravenous contrast. CONTRAST:  1 ISOVUE-300 IOPAMIDOL (ISOVUE-300) INJECTION 61% COMPARISON:  No comparison CT abdomen/pelvis. FINDINGS: Lower chest: Lung bases clear. Coronary artery calcifications. Heart size within normal limits. Hepatobiliary: No focal hepatic lesion.  Post cholecystectomy. Pancreas: No mass or inflammation. Spleen: No mass or enlargement. Adrenals/Urinary Tract: No worrisome renal or adrenal lesion. Tiny renal cysts suspected. No renal or ureteral obstructing stone. No hydronephrosis. Stomach/Bowel: Postsurgical changes gastroesophageal junction which may represent attempted reflux surgery. Gas and partially fluid-filled slightly prominence size small bowel loops without point of obstruction noted. Etiology indeterminate. Question enteritis. No extra luminal bowel inflammatory process, free fluid or free air. Specifically,  no inflammation surrounds the appendix. Vascular/Lymphatic: Calcified plaque aorta with ectasia measuring up to 2.5 cm. No large vessel occlusion. Plaque with mild narrowing iliac arteries. No adenopathy. Reproductive: Negative. Other: Fat within the inguinal canal without evidence of bowel containing hernia. Musculoskeletal: Degenerative changes lower thoracic spine and lumbar spine. No osseous destructive lesion. IMPRESSION: Gas and fluid-filled proximal to mid small bowel loops may represent changes of enteritis without point of obstruction noted. No extra luminal bowel inflammatory process or free air. No inflammation surrounds the appendix. Post attempted gastroesophageal reflux surgery. Atherosclerotic changes aorta, iliac arteries and coronary arteries. Aorta is ectatic measuring up to 2.5 cm. Electronically Signed   By: Lacy DuverneySteven  Olson M.D.   On: 10/30/2016 14:40    Procedures Procedures (including critical care time)  Medications Ordered in ED Medications  sodium chloride 0.9 % bolus 1,000 mL (0 mLs Intravenous Stopped 10/30/16 1524)  iopamidol (ISOVUE-300) 61 % injection (75 mLs Intravenous Contrast Given 10/30/16 1419)  metFORMIN (GLUCOPHAGE) tablet 500 mg (500 mg Oral Given 10/30/16 1520)     Initial Impression / Assessment and Plan / ED Course  I have reviewed the triage vital signs and the nursing notes.  Pertinent labs & imaging results that were available during my care of the patient were reviewed by me and considered in my medical decision making (see chart for details).  Clinical Course   Likely new onset dm but has some llq abdominal ttp, possibly diverticulitis. Will eval appropriately and consider starting medications for DM as well.  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 We'll start metformin. Patient can follow-up with his doctor thinks on Thursday.  Final Clinical Impressions(s) / ED Diagnoses   Final diagnoses:  Diabetes mellitus of other type without  complication, unspecified long term insulin use status (HCC)    New Prescriptions Discharge Medication List as of 10/30/2016  3:07 PM    START taking these medications   Details  metFORMIN (GLUCOPHAGE) 500 MG tablet Take 1 tablet (500 mg total) by mouth 2 (two) times daily with a meal., Starting Tue 10/30/2016, Print         Marily MemosJason Dmarco Baldus, MD 10/30/16 1620

## 2016-11-07 ENCOUNTER — Telehealth: Payer: Self-pay | Admitting: *Deleted

## 2016-11-07 ENCOUNTER — Encounter: Payer: Self-pay | Admitting: *Deleted

## 2016-11-07 DIAGNOSIS — I214 Non-ST elevation (NSTEMI) myocardial infarction: Secondary | ICD-10-CM

## 2016-11-07 DIAGNOSIS — Z9861 Coronary angioplasty status: Secondary | ICD-10-CM

## 2016-11-07 NOTE — Progress Notes (Signed)
Cardiac Individual Treatment Plan  Patient Details  Name: Jesse Soto MRN: [id number] Date of Birth: 1964/02/04 Referring Provider:   Flowsheet Row Cardiac Rehab from 06/11/2016 in [facility] Cardiac and Pulmonary Rehab  Referring Provider  Kathlyn Sacramento MD      Initial Encounter Date:  Flowsheet Row Cardiac Rehab from 06/11/2016 in [facility] Cardiac and Pulmonary Rehab  Date  06/11/16  Referring Provider  Kathlyn Sacramento MD      Visit Diagnosis: NSTEMI (non-ST elevated myocardial infarction) ([place])  S/P PTCA (percutaneous transluminal coronary angioplasty)  Patient's Home Medications on Admission:  Current Outpatient Prescriptions:  .  aspirin 81 MG chewable tablet, Chew 1 tablet (81 mg total) by mouth daily., Disp: 30 tablet, Rfl: 0 .  atorvastatin (LIPITOR) 40 MG tablet, Take 1 tablet (40 mg total) by mouth daily at 6 PM., Disp: 90 tablet, Rfl: 3 .  cyclobenzaprine (FLEXERIL) 10 MG tablet, Take 10 mg by mouth at bedtime. , Disp: , Rfl:  .  metFORMIN (GLUCOPHAGE) 500 MG tablet, Take 1 tablet (500 mg total) by mouth 2 (two) times daily with a meal., Disp: 60 tablet, Rfl: 0 .  metoprolol tartrate (LOPRESSOR) 25 MG tablet, Take 1 tablet (25 mg total) by mouth 2 (two) times daily., Disp: 180 tablet, Rfl: 3 .  nitroGLYCERIN (NITROSTAT) 0.4 MG SL tablet, Place 1 tablet (0.4 mg total) under the tongue every 5 (five) minutes as needed for chest pain., Disp: 25 tablet, Rfl: 3 .  pantoprazole (PROTONIX) 40 MG tablet, Take 1 tablet (40 mg total) by mouth daily., Disp: 90 tablet, Rfl: 3 .  ticagrelor (BRILINTA) 90 MG TABS tablet, Take 1 tablet (90 mg total) by mouth 2 (two) times daily., Disp: 180 tablet, Rfl: 3 .  traMADol (ULTRAM) 50 MG tablet, Take 50 mg by mouth daily. , Disp: , Rfl:   Past Medical History: Past Medical History:  Diagnosis Date  . Abdominal hernia   . Coronary artery disease 03/2016    non-ST elevation myocardial infarction. Cardiac catheterization showed 95% proximal RCA  stenosis with very large thrombus, moderate mid RCA stenosis, occluded distal left circumflex supplying OM 3 territory with collaterals and moderate mid LAD stenosis. EF was 45%. Successful aspiration thrombectomy and drug-eluting stent placement to the proximal right coronary artery   . HLD (hyperlipidemia)   . Low back pain   . Psoriasis   . Tobacco abuse     Tobacco Use: History  Smoking Status  . Former Smoker  . Packs/day: 1.00  . Years: 30.00  . Types: Cigarettes  . Quit date: 04/01/2016  Smokeless Tobacco  . Never Used    Labs: Recent Review Flowsheet Data    Labs for ITP Cardiac and Pulmonary Rehab Latest Ref Rng & Units 04/01/2016 05/10/2016 10/30/2016   Cholestrol 100 - 199 mg/dL 199 132 -   LDLCALC 0 - 99 mg/dL 153(H) 77 -   HDL >39 mg/dL 31(L) 33(L) -   Trlycerides 0 - 149 mg/dL 76 108 -   Hemoglobin A1c 4.0 - 6.0 % 5.7 - -   HCO3 20.0 - 28.0 mmol/L - - 19.8(L)   TCO2 0 - 100 mmol/L - - 21   ACIDBASEDEF 0.0 - 2.0 mmol/L - - 4.0(H)   O2SAT % - - 87.0       Exercise Target Goals:    Exercise Program Goal: Individual exercise prescription set with THRR, safety & activity barriers. Participant demonstrates ability to understand and report RPE using BORG scale, to self-measure pulse accurately,  and to acknowledge the importance of the exercise prescription.  Exercise Prescription Goal: Starting with aerobic activity 30 plus minutes a day, 3 days per week for initial exercise prescription. Provide home exercise prescription and guidelines that participant acknowledges understanding prior to discharge.  Activity Barriers & Risk Stratification:     Activity Barriers & Cardiac Risk Stratification - 06/11/16 1306      Activity Barriers & Cardiac Risk Stratification   Activity Barriers --  Patient reports having had right knee cartilage removed while in the [employer].  States right knee "floats" requiring him to wear a brace.  As a result, this affects his lower back - two  vertebrae are irritated due to his right knee problems.        6 Minute Walk:     6 Minute Walk    Row Name 06/11/16 1249         6 Minute Walk   Phase Initial     Distance 1300 feet     Walk Time 6 minutes     # of Rest Breaks 0     MPH 2.46     METS 3.65     RPE 13     Perceived Dyspnea  2     VO2 Peak 12.77     Symptoms No     Resting HR 47 bpm     Resting BP 122/70     Max Ex. HR 113 bpm     Max Ex. BP 132/68        Initial Exercise Prescription:     Initial Exercise Prescription - 06/11/16 1200      Date of Initial Exercise RX and Referring Provider   Date 06/11/16   Referring Provider Kathlyn Sacramento MD     Treadmill   MPH 2.2   Grade 1   Minutes 15   METs 2.99     NuStep   Level 3   Watts 30   Minutes 15   METs 2     Recumbant Elliptical   Level 3   RPM 50   Watts 30   Minutes 15   METs 2     REL-XR   Level 2   Watts 30   Minutes 15   METs 2     T5 Nustep   Level 2   Watts 30   Minutes 15   METs 2     Biostep-RELP   Level 3   Watts 30   Minutes 15   METs 2     Prescription Details   Frequency (times per week) 3   Duration Progress to 30 minutes of continuous aerobic without signs/symptoms of physical distress     Intensity   THRR 40-80% of Max Heartrate 93-138   Ratings of Perceived Exertion 11-13   Perceived Dyspnea 0-4     Progression   Progression Continue to progress workloads to maintain intensity without signs/symptoms of physical distress.     Resistance Training   Training Prescription Yes   Weight 2   Reps 10-12      Perform Capillary Blood Glucose checks as needed.  Exercise Prescription Changes:     Exercise Prescription Changes    Row Name 06/15/16 1000 06/29/16 1000 07/26/16 1400 08/20/16 1700 08/23/16 1300     Exercise Review   Progression Yes Yes Yes Yes Yes     Response to Exercise   Blood Pressure (Admit) 134/80 124/64 104/66  - 132/60   Blood Pressure (Exercise)  148/72 138/68 132/68  -  140/76   Blood Pressure (Exit) 134/68 110/60 124/72  - 128/74   Heart Rate (Admit) 66 bpm 84 bpm 81 bpm  - 80 bpm   Heart Rate (Exercise) 81 bpm 109 bpm 100 bpm  - 108 bpm   Heart Rate (Exit) 63 bpm 66 bpm 72 bpm  - 89 bpm   Rating of Perceived Exertion (Exercise) 15 13 14   - 13   Symptoms  -  - none  -  -   Duration  -  - Progress to 45 minutes of aerobic exercise without signs/symptoms of physical distress Progress to 45 minutes of aerobic exercise without signs/symptoms of physical distress Progress to 45 minutes of aerobic exercise without signs/symptoms of physical distress   Intensity  -  - THRR unchanged THRR unchanged THRR unchanged     Progression   Average METs  -  -  -  - 3.3     Resistance Training   Training Prescription Yes Yes Yes Yes  -   Weight 2 3 3 3   -   Reps 10-12 10-15 10-15 10-15  -     Treadmill   MPH 1.9 2.5  -  - 3   Grade 0 0  -  - 0   Minutes 8 20  -  - 25   METs 2.45 2.9  -  - 3.3     NuStep   Level 4  -  -  -  -   Watts 24  -  -  -  -   Minutes 20  -  -  -  -   METs 2  -  -  -  -     REL-XR   Level 1  -  -  -  -   Watts 69  -  -  -  -   Minutes 10  -  -  -  -   METs 3.8  -  -  -  -     T5 Nustep   Level  -  - 4 4  -   Minutes  -  - 20 20  -     Home Exercise Plan   Plans to continue exercise at  -  -  - Home  Plans to walk at work   -   Frequency  -  -  - Add 2 additional days to program exercise sessions.  -      Exercise Comments:     Exercise Comments    Row Name 06/11/16 1255 06/13/16 1743 06/15/16 1040 07/12/16 1359 08/09/16 1531   Exercise Comments Wears knee brace for extra support.  He would like to increase his stamina while here in program. First full day of exercise!  Patient was oriented to gym and equipment including functions, settings, policies, and procedures.  Patient's individual exercise prescription and treatment plan were reviewed.  All starting workloads were established based on the results of the 6 minute walk  test done at initial orientation visit.  The plan for exercise progression was also introduced and progression will be customized based on patient's performance and goals. Wrigley has completed 2 days of exercise and has tolerated sessions well. Izear's last session was 7/3 and no new changes have been made to his exercise program. Delwin has not attended since 07/02/16.   [place] Name 08/20/16 1753 08/23/16 1319 09/06/16 1120 09/19/16 1555     Exercise Comments Reviewed home exercise  with pt today.  Pt plans to walk at his workplace for exercise.  Reviewed THR, pulse, RPE, sign and symptoms, NTG use, and when to call 911 or MD.  Also discussed weather considerations and indoor options.  Pt voiced understanding. Mr Raineri is progressing well with exercise and has added in using the TM at work.  His knee has bothered him at times. Jaedan has not attended since 08/22/16.  He works in [place] and has trouble getting here since he has started back to work. Mr Gatley has not attended [facility] inseveral weeks - he started back to work and cannot get here on time.       Discharge Exercise Prescription (Final Exercise Prescription Changes):     Exercise Prescription Changes - 08/23/16 1300      Exercise Review   Progression Yes     Response to Exercise   Blood Pressure (Admit) 132/60   Blood Pressure (Exercise) 140/76   Blood Pressure (Exit) 128/74   Heart Rate (Admit) 80 bpm   Heart Rate (Exercise) 108 bpm   Heart Rate (Exit) 89 bpm   Rating of Perceived Exertion (Exercise) 13   Duration Progress to 45 minutes of aerobic exercise without signs/symptoms of physical distress   Intensity THRR unchanged     Progression   Average METs 3.3     Treadmill   MPH 3   Grade 0   Minutes 25   METs 3.3      Nutrition:  Target Goals: Understanding of nutrition guidelines, daily intake of sodium <1560m, cholesterol <2014m calories 30% from fat and 7% or less from saturated fats, daily to have 5 or more  servings of fruits and vegetables.  Biometrics:     Pre Biometrics - 06/11/16 1253      Pre Biometrics   Height 5' 8.5" (1.74 m)   Weight 184 lb 8 oz (83.7 kg)   Waist Circumference 40 inches   Hip Circumference 40 inches   Waist to Hip Ratio 1 %   BMI (Calculated) 27.7       Nutrition Therapy Plan and Nutrition Goals:     Nutrition Therapy & Goals - 06/29/16 1211      Nutrition Therapy   Diet Instructed on a heart healthy diet  based on 1900 calories and DASH diet principles.   Drug/Food Interactions Statins/Certain Fruits   Protein (specify units) 8   Fiber 30 grams   Whole Grain Foods 3 servings   Saturated Fats 13 max. grams   Fruits and Vegetables 5 servings/day   Sodium 2000 grams  [id number]     Personal Nutrition Goals   Personal Goal #1 Read labels for saturated fat, trans fat and sodium.   Personal Goal #2 Include a vegetable or fruit or preferably both at meals.   Personal Goal #3 Include more whole grain servings. Refer to list.     Intervention Plan   Intervention Prescribe, educate and counsel regarding individualized specific dietary modifications aiming towards targeted core components such as weight, hypertension, lipid management, diabetes, heart failure and other comorbidities.;Nutrition handout(s) given to patient.   Expected Outcomes Short Term Goal: Understand basic principles of dietary content, such as calories, fat, sodium, cholesterol and nutrients.;Short Term Goal: A plan has been developed with personal nutrition goals set during dietitian appointment.;Long Term Goal: Adherence to prescribed nutrition plan.      Nutrition Discharge: Rate Your Plate Scores:     Nutrition Assessments - 06/29/16 1215  Rate Your Plate Scores   Pre Score 56   Pre Score % 62 %      Nutrition Goals Re-Evaluation:   Psychosocial: Target Goals: Acknowledge presence or absence of depression, maximize coping skills, provide positive support system.  Participant is able to verbalize types and ability to use techniques and skills needed for reducing stress and depression.  Initial Review & Psychosocial Screening:     Initial Psych Review & Screening - 06/11/16 1321      Initial Review   Current issues with --  Gloria denies depression or history of depression.       Family Dynamics   Good Support System? Yes  Marcelis states his wife Larene Beach is very supportive.  They have 2 children and no grandchildren.       Barriers   Psychosocial barriers to participate in program There are no identifiable barriers or psychosocial needs.     Screening Interventions   Interventions Encouraged to exercise;Program counselor consult      Quality of Life Scores:     Quality of Life - 06/11/16 1323      Quality of Life Scores   Health/Function Pre 21.2 %   Socioeconomic Pre 26.25 %   Psych/Spiritual Pre 30 %   Family Pre 27.6 %   GLOBAL Pre 25.03 %      PHQ-9: Recent Review Flowsheet Data    Depression screen [facility] 2/9 06/11/2016   Decreased Interest 0   Down, Depressed, Hopeless 0   PHQ - 2 Score 0   Altered sleeping 0   Tired, decreased energy 1   Change in appetite 0   Feeling bad or failure about yourself  0   Trouble concentrating 0   Moving slowly or fidgety/restless 1   Suicidal thoughts 0   PHQ-9 Score 2   Difficult doing work/chores Somewhat difficult      Psychosocial Evaluation and Intervention:     Psychosocial Evaluation - 06/13/16 1719      Psychosocial Evaluation & Interventions   Interventions Encouraged to exercise with the program and follow exercise prescription;Stress management education;Relaxation education   Comments Counselor met with Mr. Weightman today for initial psychosocial evaluation.  He is 61 years old today and is in this program subsequent to a stent insertion in April.  He has a strong support system with a spouse of 15 years and a set of twin daughters.  He also is involved in his local church  at times.  Mr. Rajewski has a bad knee which has prohibited working out consistently in the past.  He states that he is sleeping better now and his appetite has improved for healthier options.  Mr. Blossom denies a history of depression or anxiety, but reported since taking some of the newly prescribed medications he has noticed he is more easily irritated and angry.  He states that one of the medications causes a dull headache which contributes to his current mood.  Mr. Goodner states he has spoken to his doctor about this without much success.  He goes back to work on 7/8 and states his job can be stressful at times.  Mr. Noda was very short of breath today but was committed to the exercise program.   Counselor will follow with him throughout his time in Cardiac Rehab.   Continued Psychosocial Services Needed Yes  Mr. Ozga will benefit from the psychoeducational components of this program - especially stress management and relaxation.       Psychosocial Re-Evaluation:  Psychosocial Re-Evaluation    Row Name 06/20/16 1721             Psychosocial Re-Evaluation   Comments Counselor follow up with Mr. Lembke today reporting this program is "just what I needed."  He is experiencing fewer negative symptoms and already has more energy since beginning.  He was also breathing better today on the treadmill and stated his headaches are not as severe now.  Counselor commended him on his commitment to improved health and consistent exercise.            Vocational Rehabilitation: Provide vocational rehab assistance to qualifying candidates.   Vocational Rehab Evaluation & Intervention:     Vocational Rehab - 06/11/16 1313      Initial Vocational Rehab Evaluation & Intervention   Assessment shows need for Vocational Rehabilitation No      Education: Education Goals: Education classes will be provided on a weekly basis, covering required topics. Participant will state  understanding/return demonstration of topics presented.  Learning Barriers/Preferences:     Learning Barriers/Preferences - 06/11/16 1309      Learning Barriers/Preferences   Learning Barriers Sight;Exercise Concerns  Patient reports vision has changed since MI.  Seeing Ophthalmologist on June 15th, 2017.     Learning Preferences None      Education Topics: General Nutrition Guidelines/Fats and Fiber: -Group instruction provided by verbal, written material, models and posters to present the general guidelines for heart healthy nutrition. Gives an explanation and review of dietary fats and fiber.   Controlling Sodium/Reading Food Labels: -Group verbal and written material supporting the discussion of sodium use in heart healthy nutrition. Review and explanation with models, verbal and written materials for utilization of the food label.   Exercise Physiology & Risk Factors: - Group verbal and written instruction with models to review the exercise physiology of the cardiovascular system and associated critical values. Details cardiovascular disease risk factors and the goals associated with each risk factor. Flowsheet Row Cardiac Rehab from 06/27/2016 in [facility] Cardiac and Pulmonary Rehab  Date  06/13/16  Educator  AS  Instruction Review Code  2- meets goals/outcomes      Aerobic Exercise & Resistance Training: - Gives group verbal and written discussion on the health impact of inactivity. On the components of aerobic and resistive training programs and the benefits of this training and how to safely progress through these programs.   Flexibility, Balance, General Exercise Guidelines: - Provides group verbal and written instruction on the benefits of flexibility and balance training programs. Provides general exercise guidelines with specific guidelines to those with heart or lung disease. Demonstration and skill practice provided. Flowsheet Row Cardiac Rehab from 06/27/2016 in [facility]  Cardiac and Pulmonary Rehab  Date  06/20/16  Educator  AS  Instruction Review Code  2- meets goals/outcomes      Stress Management: - Provides group verbal and written instruction about the health risks of elevated stress, cause of high stress, and healthy ways to reduce stress. Flowsheet Row Cardiac Rehab from 06/27/2016 in [facility] Cardiac and Pulmonary Rehab  Date  06/27/16  Educator  Kathreen Cornfield, [facility]  Instruction Review Code  2- meets goals/outcomes      Depression: - Provides group verbal and written instruction on the correlation between heart/lung disease and depressed mood, treatment options, and the stigmas associated with seeking treatment.   Anatomy & Physiology of the Heart: - Group verbal and written instruction and models provide basic cardiac anatomy and physiology, with the coronary electrical  and arterial systems. Review of: AMI, Angina, Valve disease, Heart Failure, Cardiac Arrhythmia, Pacemakers, and the ICD.   Cardiac Procedures: - Group verbal and written instruction and models to describe the testing methods done to diagnose heart disease. Reviews the outcomes of the test results. Describes the treatment choices: Medical Management, Angioplasty, or Coronary Bypass Surgery.   Cardiac Medications: - Group verbal and written instruction to review commonly prescribed medications for heart disease. Reviews the medication, class of the drug, and side effects. Includes the steps to properly store meds and maintain the prescription regimen.   Go Sex-Intimacy & Heart Disease, Get SMART - Goal Setting: - Group verbal and written instruction through game format to discuss heart disease and the return to sexual intimacy. Provides group verbal and written material to discuss and apply goal setting through the application of the S.M.A.R.T. Method.   Other Matters of the Heart: - Provides group verbal, written materials and models to describe Heart Failure, Angina, Valve  Disease, and Diabetes in the realm of heart disease. Includes description of the disease process and treatment options available to the cardiac patient.   Exercise & Equipment Safety: - Individual verbal instruction and demonstration of equipment use and safety with use of the equipment. Flowsheet Row Cardiac Rehab from 06/11/2016 in [facility] Cardiac and Pulmonary Rehab  Date  06/11/16  Educator  D.W.  Instruction Review Code  1- partially meets, needs review/practice      Infection Prevention: - Provides verbal and written material to individual with discussion of infection control including proper hand washing and proper equipment cleaning during exercise session. Flowsheet Row Cardiac Rehab from 06/11/2016 in [facility] Cardiac and Pulmonary Rehab  Date  06/11/16  Educator  DW  Instruction Review Code  2- meets goals/outcomes      Falls Prevention: - Provides verbal and written material to individual with discussion of falls prevention and safety. Flowsheet Row Cardiac Rehab from 06/11/2016 in [facility] Cardiac and Pulmonary Rehab  Date  06/11/16  Educator  DW  Instruction Review Code  2- meets goals/outcomes      Diabetes: - Individual verbal and written instruction to review signs/symptoms of diabetes, desired ranges of glucose level fasting, after meals and with exercise. Advice that pre and post exercise glucose checks will be done for 3 sessions at entry of program.    Knowledge Questionnaire Score:     Knowledge Questionnaire Score - 06/11/16 1313      Knowledge Questionnaire Score   Pre Score 21/28      Core Components/Risk Factors/Patient Goals at Admission:     Personal Goals and Risk Factors at Admission - 06/11/16 1316      Core Components/Risk Factors/Patient Goals on Admission    Weight Management Yes;Weight Maintenance   Intervention Weight Management: Provide education and appropriate resources to help participant work on and attain dietary goals.;Weight  Management: Develop a combined nutrition and exercise program designed to reach desired caloric intake, while maintaining appropriate intake of nutrient and fiber, sodium and fats, and appropriate energy expenditure required for the weight goal.   Admit Weight 184 lb 8 oz (83.7 kg)   Goal Weight: Short Term 184 lb 8 oz (83.7 kg)   Goal Weight: Long Term 184 lb 8 oz (83.7 kg)   Expected Outcomes Weight Maintenance: Understanding of the daily nutrition guidelines, which includes 25-35% calories from fat, 7% or less cal from saturated fats, less than 29m cholesterol, less than 1.5gm of sodium, & 5 or more servings of fruits  and vegetables daily   Sedentary Yes   Intervention Provide advice, education, support and counseling about physical activity/exercise needs.;Develop an individualized exercise prescription for aerobic and resistive training based on initial evaluation findings, risk stratification, comorbidities and participant's personal goals.   Expected Outcomes Achievement of increased cardiorespiratory fitness and enhanced flexibility, muscular endurance and strength shown through measurements of functional capacity and personal statement of participant.   Increase Strength and Stamina Yes   Intervention Provide advice, education, support and counseling about physical activity/exercise needs.;Develop an individualized exercise prescription for aerobic and resistive training based on initial evaluation findings, risk stratification, comorbidities and participant's personal goals.   Expected Outcomes Achievement of increased cardiorespiratory fitness and enhanced flexibility, muscular endurance and strength shown through measurements of functional capacity and personal statement of participant.   Hypertension Yes   Intervention Provide education on lifestyle modifcations including regular physical activity/exercise, weight management, moderate sodium restriction and increased consumption of fresh  fruit, vegetables, and low fat dairy, alcohol moderation, and smoking cessation.;Monitor prescription use compliance.   Expected Outcomes Short Term: Continued assessment and intervention until BP is < 140/76m HG in hypertensive participants. < 130/869mHG in hypertensive participants with diabetes, heart failure or chronic kidney disease.;Long Term: Maintenance of blood pressure at goal levels.   Lipids Yes   Intervention Provide education and support for participant on nutrition & aerobic/resistive exercise along with prescribed medications to achieve LDL <7085mHDL >2m105m Expected Outcomes Short Term: Participant states understanding of desired cholesterol values and is compliant with medications prescribed. Participant is following exercise prescription and nutrition guidelines.;Long Term: Cholesterol controlled with medications as prescribed, with individualized exercise RX and with personalized nutrition plan. Value goals: LDL < 70mg48mL > 40 mg.      Core Components/Risk Factors/Patient Goals Review:      Goals and Risk Factor Review    Row Name 06/28/16 1333             Core Components/Risk Factors/Patient Goals Review   Personal Goals Review Increase Strength and Stamina       Review RickyJearlded he is feeling stronger since begnning Heart Track.       Expected Outcomes RickyArjen continue to be consistent with exercise and see improvement in overall function.          Core Components/Risk Factors/Patient Goals at Discharge (Final Review):      Goals and Risk Factor Review - 06/28/16 1333      Core Components/Risk Factors/Patient Goals Review   Personal Goals Review Increase Strength and Stamina   Review RickyTomislaved he is feeling stronger since begnning Heart Track.   Expected Outcomes RickyMatan continue to be consistent with exercise and see improvement in overall function.      ITP Comments:     ITP Comments    Row Name 06/20/16 0927 06/20/16 1830 06/20/16  1831 06/25/16 1902 07/18/16 0802   ITP Comments 30 day review.  Continue with ITP  New to program Brogen informed staff in Cardiac Rehab today that he had stopped taking his Imdur 30 mg [place] informed staff in Cardiac Rehab today that he had stopped taking his Imdur 30 mg once a day because he was having a constant headache.  He stated he has not taken it for the past three days.  He did not take it on Monday because [place] [facility] [place] and he just could not drive there with a headache.  He states his SOB has improved and he has not had any chest pain.  He was able to do 5 minutes warm up on treadmill then 15 minutes on Treadmill at 2.5 mph.  Will contact Dr. Tyrell Antonio nurse to inform MD that patient has stopped taking Imdur.  No cardiac symptoms reported while exercising.   Damaris Schooner did not complete his rehab session.  Antwaine presented to Cardiac Rehab today at 4 p.m. As scheduled.  He stated he did not think he was going to be able to exercise, as he was having left groin pain.  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  He stated he took him over 15 minutes to walk from his car to get to the rehab gym.  Nurse agreed that he would not be able to exercise.  Nurse instructed him to get evaluated by MD prior to returning to cardiac rehab.  Nurse asked patient if he needed assistance getting to his car.  He refused offer of assistance in getting to his car.   30 day review. Continue with ITP   Row Name 07/26/16 1421 08/15/16 0831 08/23/16 1320 09/06/16 1121 09/12/16 0704   ITP Comments Mr Castille hasnt attended since 7/3.  LMOM about returning to class. 30 day review. Continue with ITP unless changes noted by Medical Director at signature of review. Mr Busche is progressing well with exercise and has added in using the TM at work.  His knee has bothered him at times. Raju has not attended since 08/22/16.  He works in [place] and has trouble getting here  since he has started back to work 30 day review. Continue with ITP unless changes noted by Medical Director at signature of review.   [place] Name 10/10/16 0703 11/07/16 0630         ITP Comments 30 day review. Continue with ITP unless changes noted by Medical Director at signature of review.  HAs been absent 30 day review completed for Medical Director physician review and signature. Continue ITP unless changes made by physician. Remains absent         Comments:

## 2016-11-07 NOTE — Telephone Encounter (Signed)
Called to check on status to return to program. Left message with someone that answered the number I called for Jesse Soto to let us know if he plans to return to the program.

## 2016-11-19 ENCOUNTER — Encounter: Payer: Self-pay | Admitting: *Deleted

## 2016-11-19 DIAGNOSIS — I214 Non-ST elevation (NSTEMI) myocardial infarction: Secondary | ICD-10-CM

## 2016-11-19 DIAGNOSIS — Z9861 Coronary angioplasty status: Secondary | ICD-10-CM

## 2016-11-19 NOTE — Progress Notes (Signed)
Cardiac Individual Treatment Plan  Patient Details  Name: Jesse Soto MRN: [id number] Date of Birth: February 26, 1955 Referring Provider:   Flowsheet Row Cardiac Rehab from 06/11/2016 in [facility] Cardiac and Pulmonary Rehab  Referring Provider  Kathlyn Sacramento MD      Initial Encounter Date:  Flowsheet Row Cardiac Rehab from 06/11/2016 in [facility] Cardiac and Pulmonary Rehab  Date  06/11/16  Referring Provider  Kathlyn Sacramento MD      Visit Diagnosis: NSTEMI (non-ST elevated myocardial infarction) ([place])  S/P PTCA (percutaneous transluminal coronary angioplasty)  Patient's Home Medications on Admission:  Current Outpatient Prescriptions:  .  aspirin 81 MG chewable tablet, Chew 1 tablet (81 mg total) by mouth daily., Disp: 30 tablet, Rfl: 0 .  atorvastatin (LIPITOR) 40 MG tablet, Take 1 tablet (40 mg total) by mouth daily at 6 PM., Disp: 90 tablet, Rfl: 3 .  cyclobenzaprine (FLEXERIL) 10 MG tablet, Take 10 mg by mouth at bedtime. , Disp: , Rfl:  .  metFORMIN (GLUCOPHAGE) 500 MG tablet, Take 1 tablet (500 mg total) by mouth 2 (two) times daily with a meal., Disp: 60 tablet, Rfl: 0 .  metoprolol tartrate (LOPRESSOR) 25 MG tablet, Take 1 tablet (25 mg total) by mouth 2 (two) times daily., Disp: 180 tablet, Rfl: 3 .  nitroGLYCERIN (NITROSTAT) 0.4 MG SL tablet, Place 1 tablet (0.4 mg total) under the tongue every 5 (five) minutes as needed for chest pain., Disp: 25 tablet, Rfl: 3 .  pantoprazole (PROTONIX) 40 MG tablet, Take 1 tablet (40 mg total) by mouth daily., Disp: 90 tablet, Rfl: 3 .  ticagrelor (BRILINTA) 90 MG TABS tablet, Take 1 tablet (90 mg total) by mouth 2 (two) times daily., Disp: 180 tablet, Rfl: 3 .  traMADol (ULTRAM) 50 MG tablet, Take 50 mg by mouth daily. , Disp: , Rfl:   Past Medical History: Past Medical History:  Diagnosis Date  . Abdominal hernia   . Coronary artery disease 03/2016    non-ST elevation myocardial infarction. Cardiac catheterization showed 95% proximal RCA  stenosis with very large thrombus, moderate mid RCA stenosis, occluded distal left circumflex supplying OM 3 territory with collaterals and moderate mid LAD stenosis. EF was 45%. Successful aspiration thrombectomy and drug-eluting stent placement to the proximal right coronary artery   . HLD (hyperlipidemia)   . Low back pain   . Psoriasis   . Tobacco abuse     Tobacco Use: History  Smoking Status  . Former Smoker  . Packs/day: 1.00  . Years: 30.00  . Types: Cigarettes  . Quit date: 04/01/2016  Smokeless Tobacco  . Never Used    Labs: Recent Review Flowsheet Data    Labs for ITP Cardiac and Pulmonary Rehab Latest Ref Rng & Units 04/01/2016 05/10/2016 10/30/2016   Cholestrol 100 - 199 mg/dL 199 132 -   LDLCALC 0 - 99 mg/dL 153(H) 77 -   HDL >39 mg/dL 31(L) 33(L) -   Trlycerides 0 - 149 mg/dL 76 108 -   Hemoglobin A1c 4.0 - 6.0 % 5.7 - -   HCO3 20.0 - 28.0 mmol/L - - 19.8(L)   TCO2 0 - 100 mmol/L - - 21   ACIDBASEDEF 0.0 - 2.0 mmol/L - - 4.0(H)   O2SAT % - - 87.0       Exercise Target Goals:    Exercise Program Goal: Individual exercise prescription set with THRR, safety & activity barriers. Participant demonstrates ability to understand and report RPE using BORG scale, to self-measure pulse accurately,  and to acknowledge the importance of the exercise prescription.  Exercise Prescription Goal: Starting with aerobic activity 30 plus minutes a day, 3 days per week for initial exercise prescription. Provide home exercise prescription and guidelines that participant acknowledges understanding prior to discharge.  Activity Barriers & Risk Stratification:     Activity Barriers & Cardiac Risk Stratification - 06/11/16 1306      Activity Barriers & Cardiac Risk Stratification   Activity Barriers --  Patient reports having had right knee cartilage removed while in the [employer].  States right knee "floats" requiring him to wear a brace.  As a result, this affects his lower back - two  vertebrae are irritated due to his right knee problems.        6 Minute Walk:     6 Minute Walk    Row Name 06/11/16 1249         6 Minute Walk   Phase Initial     Distance 1300 feet     Walk Time 6 minutes     # of Rest Breaks 0     MPH 2.46     METS 3.65     RPE 13     Perceived Dyspnea  2     VO2 Peak 12.77     Symptoms No     Resting HR 47 bpm     Resting BP 122/70     Max Ex. HR 113 bpm     Max Ex. BP 132/68        Initial Exercise Prescription:     Initial Exercise Prescription - 06/11/16 1200      Date of Initial Exercise RX and Referring Provider   Date 06/11/16   Referring Provider Kathlyn Sacramento MD     Treadmill   MPH 2.2   Grade 1   Minutes 15   METs 2.99     NuStep   Level 3   Watts 30   Minutes 15   METs 2     Recumbant Elliptical   Level 3   RPM 50   Watts 30   Minutes 15   METs 2     REL-XR   Level 2   Watts 30   Minutes 15   METs 2     T5 Nustep   Level 2   Watts 30   Minutes 15   METs 2     Biostep-RELP   Level 3   Watts 30   Minutes 15   METs 2     Prescription Details   Frequency (times per week) 3   Duration Progress to 30 minutes of continuous aerobic without signs/symptoms of physical distress     Intensity   THRR 40-80% of Max Heartrate 93-138   Ratings of Perceived Exertion 11-13   Perceived Dyspnea 0-4     Progression   Progression Continue to progress workloads to maintain intensity without signs/symptoms of physical distress.     Resistance Training   Training Prescription Yes   Weight 2   Reps 10-12      Perform Capillary Blood Glucose checks as needed.  Exercise Prescription Changes:     Exercise Prescription Changes    Row Name 06/15/16 1000 06/29/16 1000 07/26/16 1400 08/20/16 1700 08/23/16 1300     Exercise Review   Progression Yes Yes Yes Yes Yes     Response to Exercise   Blood Pressure (Admit) 134/80 124/64 104/66  - 132/60   Blood Pressure (Exercise)  148/72 138/68 132/68  -  140/76   Blood Pressure (Exit) 134/68 110/60 124/72  - 128/74   Heart Rate (Admit) 66 bpm 84 bpm 81 bpm  - 80 bpm   Heart Rate (Exercise) 81 bpm 109 bpm 100 bpm  - 108 bpm   Heart Rate (Exit) 63 bpm 66 bpm 72 bpm  - 89 bpm   Rating of Perceived Exertion (Exercise) 15 13 14   - 13   Symptoms  -  - none  -  -   Duration  -  - Progress to 45 minutes of aerobic exercise without signs/symptoms of physical distress Progress to 45 minutes of aerobic exercise without signs/symptoms of physical distress Progress to 45 minutes of aerobic exercise without signs/symptoms of physical distress   Intensity  -  - THRR unchanged THRR unchanged THRR unchanged     Progression   Average METs  -  -  -  - 3.3     Resistance Training   Training Prescription Yes Yes Yes Yes  -   Weight 2 3 3 3   -   Reps 10-12 10-15 10-15 10-15  -     Treadmill   MPH 1.9 2.5  -  - 3   Grade 0 0  -  - 0   Minutes 8 20  -  - 25   METs 2.45 2.9  -  - 3.3     NuStep   Level 4  -  -  -  -   Watts 24  -  -  -  -   Minutes 20  -  -  -  -   METs 2  -  -  -  -     REL-XR   Level 1  -  -  -  -   Watts 69  -  -  -  -   Minutes 10  -  -  -  -   METs 3.8  -  -  -  -     T5 Nustep   Level  -  - 4 4  -   Minutes  -  - 20 20  -     Home Exercise Plan   Plans to continue exercise at  -  -  - Home  Plans to walk at work   -   Frequency  -  -  - Add 2 additional days to program exercise sessions.  -      Exercise Comments:     Exercise Comments    Row Name 06/11/16 1255 06/13/16 1743 06/15/16 1040 07/12/16 1359 08/09/16 1531   Exercise Comments Wears knee brace for extra support.  He would like to increase his stamina while here in program. First full day of exercise!  Patient was oriented to gym and equipment including functions, settings, policies, and procedures.  Patient's individual exercise prescription and treatment plan were reviewed.  All starting workloads were established based on the results of the 6 minute walk  test done at initial orientation visit.  The plan for exercise progression was also introduced and progression will be customized based on patient's performance and goals. Brallan has completed 2 days of exercise and has tolerated sessions well. Jervis's last session was 7/3 and no new changes have been made to his exercise program. Freddrick has not attended since 07/02/16.   Leith Name 08/20/16 1753 08/23/16 1319 09/06/16 1120 09/19/16 1555     Exercise Comments Reviewed home exercise  with pt today.  Pt plans to walk at his workplace for exercise.  Reviewed THR, pulse, RPE, sign and symptoms, NTG use, and when to call 911 or MD.  Also discussed weather considerations and indoor options.  Pt voiced understanding. Mr Rocca is progressing well with exercise and has added in using the TM at work.  His knee has bothered him at times. Deronte has not attended since 08/22/16.  He works in [place] and has trouble getting here since he has started back to work. Mr Anselmi has not attended [facility] inseveral weeks - he started back to work and cannot get here on time.       Discharge Exercise Prescription (Final Exercise Prescription Changes):     Exercise Prescription Changes - 08/23/16 1300      Exercise Review   Progression Yes     Response to Exercise   Blood Pressure (Admit) 132/60   Blood Pressure (Exercise) 140/76   Blood Pressure (Exit) 128/74   Heart Rate (Admit) 80 bpm   Heart Rate (Exercise) 108 bpm   Heart Rate (Exit) 89 bpm   Rating of Perceived Exertion (Exercise) 13   Duration Progress to 45 minutes of aerobic exercise without signs/symptoms of physical distress   Intensity THRR unchanged     Progression   Average METs 3.3     Treadmill   MPH 3   Grade 0   Minutes 25   METs 3.3      Nutrition:  Target Goals: Understanding of nutrition guidelines, daily intake of sodium <1561m, cholesterol <2054m calories 30% from fat and 7% or less from saturated fats, daily to have 5 or more  servings of fruits and vegetables.  Biometrics:     Pre Biometrics - 06/11/16 1253      Pre Biometrics   Height 5' 8.5" (1.74 m)   Weight 184 lb 8 oz (83.7 kg)   Waist Circumference 40 inches   Hip Circumference 40 inches   Waist to Hip Ratio 1 %   BMI (Calculated) 27.7       Nutrition Therapy Plan and Nutrition Goals:     Nutrition Therapy & Goals - 06/29/16 1211      Nutrition Therapy   Diet Instructed on a heart healthy diet  based on 1900 calories and DASH diet principles.   Drug/Food Interactions Statins/Certain Fruits   Protein (specify units) 8   Fiber 30 grams   Whole Grain Foods 3 servings   Saturated Fats 13 max. grams   Fruits and Vegetables 5 servings/day   Sodium 2000 grams  [id number]     Personal Nutrition Goals   Personal Goal #1 Read labels for saturated fat, trans fat and sodium.   Personal Goal #2 Include a vegetable or fruit or preferably both at meals.   Personal Goal #3 Include more whole grain servings. Refer to list.     Intervention Plan   Intervention Prescribe, educate and counsel regarding individualized specific dietary modifications aiming towards targeted core components such as weight, hypertension, lipid management, diabetes, heart failure and other comorbidities.;Nutrition handout(s) given to patient.   Expected Outcomes Short Term Goal: Understand basic principles of dietary content, such as calories, fat, sodium, cholesterol and nutrients.;Short Term Goal: A plan has been developed with personal nutrition goals set during dietitian appointment.;Long Term Goal: Adherence to prescribed nutrition plan.      Nutrition Discharge: Rate Your Plate Scores:     Nutrition Assessments - 06/29/16 1215  Rate Your Plate Scores   Pre Score 56   Pre Score % 62 %      Nutrition Goals Re-Evaluation:   Psychosocial: Target Goals: Acknowledge presence or absence of depression, maximize coping skills, provide positive support system.  Participant is able to verbalize types and ability to use techniques and skills needed for reducing stress and depression.  Initial Review & Psychosocial Screening:     Initial Psych Review & Screening - 06/11/16 1321      Initial Review   Current issues with --  Bronislaus denies depression or history of depression.       Family Dynamics   Good Support System? Yes  Enis states his wife Larene Beach is very supportive.  They have 2 children and no grandchildren.       Barriers   Psychosocial barriers to participate in program There are no identifiable barriers or psychosocial needs.     Screening Interventions   Interventions Encouraged to exercise;Program counselor consult      Quality of Life Scores:     Quality of Life - 06/11/16 1323      Quality of Life Scores   Health/Function Pre 21.2 %   Socioeconomic Pre 26.25 %   Psych/Spiritual Pre 30 %   Family Pre 27.6 %   GLOBAL Pre 25.03 %      PHQ-9: Recent Review Flowsheet Data    Depression screen [facility] 2/9 06/11/2016   Decreased Interest 0   Down, Depressed, Hopeless 0   PHQ - 2 Score 0   Altered sleeping 0   Tired, decreased energy 1   Change in appetite 0   Feeling bad or failure about yourself  0   Trouble concentrating 0   Moving slowly or fidgety/restless 1   Suicidal thoughts 0   PHQ-9 Score 2   Difficult doing work/chores Somewhat difficult      Psychosocial Evaluation and Intervention:     Psychosocial Evaluation - 06/13/16 1719      Psychosocial Evaluation & Interventions   Interventions Encouraged to exercise with the program and follow exercise prescription;Stress management education;Relaxation education   Comments Counselor met with Mr. Pam today for initial psychosocial evaluation.  He is 61 years old today and is in this program subsequent to a stent insertion in April.  He has a strong support system with a spouse of 15 years and a set of twin daughters.  He also is involved in his local church  at times.  Mr. Maniaci has a bad knee which has prohibited working out consistently in the past.  He states that he is sleeping better now and his appetite has improved for healthier options.  Mr. Meditz denies a history of depression or anxiety, but reported since taking some of the newly prescribed medications he has noticed he is more easily irritated and angry.  He states that one of the medications causes a dull headache which contributes to his current mood.  Mr. Meester states he has spoken to his doctor about this without much success.  He goes back to work on 7/8 and states his job can be stressful at times.  Mr. Buss was very short of breath today but was committed to the exercise program.   Counselor will follow with him throughout his time in Cardiac Rehab.   Continued Psychosocial Services Needed Yes  Mr. Mannis will benefit from the psychoeducational components of this program - especially stress management and relaxation.       Psychosocial Re-Evaluation:  Psychosocial Re-Evaluation    Row Name 06/20/16 1721             Psychosocial Re-Evaluation   Comments Counselor follow up with Mr. Luellen today reporting this program is "just what I needed."  He is experiencing fewer negative symptoms and already has more energy since beginning.  He was also breathing better today on the treadmill and stated his headaches are not as severe now.  Counselor commended him on his commitment to improved health and consistent exercise.            Vocational Rehabilitation: Provide vocational rehab assistance to qualifying candidates.   Vocational Rehab Evaluation & Intervention:     Vocational Rehab - 06/11/16 1313      Initial Vocational Rehab Evaluation & Intervention   Assessment shows need for Vocational Rehabilitation No      Education: Education Goals: Education classes will be provided on a weekly basis, covering required topics. Participant will state  understanding/return demonstration of topics presented.  Learning Barriers/Preferences:     Learning Barriers/Preferences - 06/11/16 1309      Learning Barriers/Preferences   Learning Barriers Sight;Exercise Concerns  Patient reports vision has changed since MI.  Seeing Ophthalmologist on June 15th, 2017.     Learning Preferences None      Education Topics: General Nutrition Guidelines/Fats and Fiber: -Group instruction provided by verbal, written material, models and posters to present the general guidelines for heart healthy nutrition. Gives an explanation and review of dietary fats and fiber.   Controlling Sodium/Reading Food Labels: -Group verbal and written material supporting the discussion of sodium use in heart healthy nutrition. Review and explanation with models, verbal and written materials for utilization of the food label.   Exercise Physiology & Risk Factors: - Group verbal and written instruction with models to review the exercise physiology of the cardiovascular system and associated critical values. Details cardiovascular disease risk factors and the goals associated with each risk factor. Flowsheet Row Cardiac Rehab from 06/27/2016 in [facility] Cardiac and Pulmonary Rehab  Date  06/13/16  Educator  AS  Instruction Review Code  2- meets goals/outcomes      Aerobic Exercise & Resistance Training: - Gives group verbal and written discussion on the health impact of inactivity. On the components of aerobic and resistive training programs and the benefits of this training and how to safely progress through these programs.   Flexibility, Balance, General Exercise Guidelines: - Provides group verbal and written instruction on the benefits of flexibility and balance training programs. Provides general exercise guidelines with specific guidelines to those with heart or lung disease. Demonstration and skill practice provided. Flowsheet Row Cardiac Rehab from 06/27/2016 in [facility]  Cardiac and Pulmonary Rehab  Date  06/20/16  Educator  AS  Instruction Review Code  2- meets goals/outcomes      Stress Management: - Provides group verbal and written instruction about the health risks of elevated stress, cause of high stress, and healthy ways to reduce stress. Flowsheet Row Cardiac Rehab from 06/27/2016 in [facility] Cardiac and Pulmonary Rehab  Date  06/27/16  Educator  Kathreen Cornfield, [facility]  Instruction Review Code  2- meets goals/outcomes      Depression: - Provides group verbal and written instruction on the correlation between heart/lung disease and depressed mood, treatment options, and the stigmas associated with seeking treatment.   Anatomy & Physiology of the Heart: - Group verbal and written instruction and models provide basic cardiac anatomy and physiology, with the coronary electrical  and arterial systems. Review of: AMI, Angina, Valve disease, Heart Failure, Cardiac Arrhythmia, Pacemakers, and the ICD.   Cardiac Procedures: - Group verbal and written instruction and models to describe the testing methods done to diagnose heart disease. Reviews the outcomes of the test results. Describes the treatment choices: Medical Management, Angioplasty, or Coronary Bypass Surgery.   Cardiac Medications: - Group verbal and written instruction to review commonly prescribed medications for heart disease. Reviews the medication, class of the drug, and side effects. Includes the steps to properly store meds and maintain the prescription regimen.   Go Sex-Intimacy & Heart Disease, Get SMART - Goal Setting: - Group verbal and written instruction through game format to discuss heart disease and the return to sexual intimacy. Provides group verbal and written material to discuss and apply goal setting through the application of the S.M.A.R.T. Method.   Other Matters of the Heart: - Provides group verbal, written materials and models to describe Heart Failure, Angina, Valve  Disease, and Diabetes in the realm of heart disease. Includes description of the disease process and treatment options available to the cardiac patient.   Exercise & Equipment Safety: - Individual verbal instruction and demonstration of equipment use and safety with use of the equipment. Flowsheet Row Cardiac Rehab from 06/11/2016 in [facility] Cardiac and Pulmonary Rehab  Date  06/11/16  Educator  D.W.  Instruction Review Code  1- partially meets, needs review/practice      Infection Prevention: - Provides verbal and written material to individual with discussion of infection control including proper hand washing and proper equipment cleaning during exercise session. Flowsheet Row Cardiac Rehab from 06/11/2016 in [facility] Cardiac and Pulmonary Rehab  Date  06/11/16  Educator  DW  Instruction Review Code  2- meets goals/outcomes      Falls Prevention: - Provides verbal and written material to individual with discussion of falls prevention and safety. Flowsheet Row Cardiac Rehab from 06/11/2016 in [facility] Cardiac and Pulmonary Rehab  Date  06/11/16  Educator  DW  Instruction Review Code  2- meets goals/outcomes      Diabetes: - Individual verbal and written instruction to review signs/symptoms of diabetes, desired ranges of glucose level fasting, after meals and with exercise. Advice that pre and post exercise glucose checks will be done for 3 sessions at entry of program.    Knowledge Questionnaire Score:     Knowledge Questionnaire Score - 06/11/16 1313      Knowledge Questionnaire Score   Pre Score 21/28      Core Components/Risk Factors/Patient Goals at Admission:     Personal Goals and Risk Factors at Admission - 06/11/16 1316      Core Components/Risk Factors/Patient Goals on Admission    Weight Management Yes;Weight Maintenance   Intervention Weight Management: Provide education and appropriate resources to help participant work on and attain dietary goals.;Weight  Management: Develop a combined nutrition and exercise program designed to reach desired caloric intake, while maintaining appropriate intake of nutrient and fiber, sodium and fats, and appropriate energy expenditure required for the weight goal.   Admit Weight 184 lb 8 oz (83.7 kg)   Goal Weight: Short Term 184 lb 8 oz (83.7 kg)   Goal Weight: Long Term 184 lb 8 oz (83.7 kg)   Expected Outcomes Weight Maintenance: Understanding of the daily nutrition guidelines, which includes 25-35% calories from fat, 7% or less cal from saturated fats, less than 238m cholesterol, less than 1.5gm of sodium, & 5 or more servings of fruits  and vegetables daily   Sedentary Yes   Intervention Provide advice, education, support and counseling about physical activity/exercise needs.;Develop an individualized exercise prescription for aerobic and resistive training based on initial evaluation findings, risk stratification, comorbidities and participant's personal goals.   Expected Outcomes Achievement of increased cardiorespiratory fitness and enhanced flexibility, muscular endurance and strength shown through measurements of functional capacity and personal statement of participant.   Increase Strength and Stamina Yes   Intervention Provide advice, education, support and counseling about physical activity/exercise needs.;Develop an individualized exercise prescription for aerobic and resistive training based on initial evaluation findings, risk stratification, comorbidities and participant's personal goals.   Expected Outcomes Achievement of increased cardiorespiratory fitness and enhanced flexibility, muscular endurance and strength shown through measurements of functional capacity and personal statement of participant.   Hypertension Yes   Intervention Provide education on lifestyle modifcations including regular physical activity/exercise, weight management, moderate sodium restriction and increased consumption of fresh  fruit, vegetables, and low fat dairy, alcohol moderation, and smoking cessation.;Monitor prescription use compliance.   Expected Outcomes Short Term: Continued assessment and intervention until BP is < 140/90m HG in hypertensive participants. < 130/853mHG in hypertensive participants with diabetes, heart failure or chronic kidney disease.;Long Term: Maintenance of blood pressure at goal levels.   Lipids Yes   Intervention Provide education and support for participant on nutrition & aerobic/resistive exercise along with prescribed medications to achieve LDL <7018mHDL >8m27m Expected Outcomes Short Term: Participant states understanding of desired cholesterol values and is compliant with medications prescribed. Participant is following exercise prescription and nutrition guidelines.;Long Term: Cholesterol controlled with medications as prescribed, with individualized exercise RX and with personalized nutrition plan. Value goals: LDL < 70mg37mL > 40 mg.      Core Components/Risk Factors/Patient Goals Review:      Goals and Risk Factor Review    Row Name 06/28/16 1333             Core Components/Risk Factors/Patient Goals Review   Personal Goals Review Increase Strength and Stamina       Review RickyDimitried he is feeling stronger since begnning Heart Track.       Expected Outcomes RickyNasim continue to be consistent with exercise and see improvement in overall function.          Core Components/Risk Factors/Patient Goals at Discharge (Final Review):      Goals and Risk Factor Review - 06/28/16 1333      Core Components/Risk Factors/Patient Goals Review   Personal Goals Review Increase Strength and Stamina   Review RickyMouhamadoued he is feeling stronger since begnning Heart Track.   Expected Outcomes RickyPhilippe continue to be consistent with exercise and see improvement in overall function.      ITP Comments:     ITP Comments    Row Name 06/20/16 0927 06/20/16 1830 06/20/16  1831 06/25/16 1902 07/18/16 0802   ITP Comments 30 day review.  Continue with ITP  New to program Quinterrius informed staff in Cardiac Rehab today that he had stopped taking his Imdur 30 mg Lynard informed staff in Cardiac Rehab today that he had stopped taking his Imdur 30 mg once a day because he was having a constant headache.  He stated he has not taken it for the past three days.  He did not take it on Monday [place] [facility] [place] [facility] [place] and he just could not drive there with a headache.  He states his SOB has improved and he has not had any chest pain.  He was able to do 5 minutes warm up on treadmill then 15 minutes on Treadmill at 2.5 mph.  Will contact Dr. Tyrell Antonio nurse to inform MD that patient has stopped taking Imdur.  No cardiac symptoms reported while exercising.   Damaris Schooner did not complete his rehab session.  Vint presented to Cardiac Rehab today at 4 p.m. As scheduled.  He stated he did not think he was going to be able to exercise, as he was having left groin pain.  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  He stated he took him over 15 minutes to walk from his car to get to the rehab gym.  Nurse agreed that he would not be able to exercise.  Nurse instructed him to get evaluated by MD prior to returning to cardiac rehab.  Nurse asked patient if he needed assistance getting to his car.  He refused offer of assistance in getting to his car.   30 day review. Continue with ITP   Row Name 07/26/16 1421 08/15/16 0831 08/23/16 1320 09/06/16 1121 09/12/16 0704   ITP Comments Mr Balaban hasnt attended since 7/3.  LMOM about returning to class. 30 day review. Continue with ITP unless changes noted by Medical Director at signature of review. Mr Willette is progressing well with exercise and has added in using the TM at work.  His knee has bothered him at times. Jobe has not attended since 08/22/16.  He works in [place] and has trouble getting here  since he has started back to work 30 day review. Continue with ITP unless changes noted by Medical Director at signature of review.   [place]agoner Name 10/10/16 0703 11/07/16 0630 11/19/16 1441       ITP Comments 30 day review. Continue with ITP unless changes noted by Medical Director at signature of review.  HAs been absent 30 day review completed for Medical Director physician review and signature. Continue ITP unless changes made by physician. Remains absent discharged   not been here over 4 weeks, no response to calls        Comments:

## 2016-11-19 NOTE — Progress Notes (Signed)
Discharge Summary  Patient Details  Name: Jesse AreolaRicky D Soto MRN: [id number] Date of Birth: 14-Feb-1955 Referring Provider:   Flowsheet Row Cardiac Rehab [facility]  Referring Provider  Lorine BearsArida, Muhammad MD       Number of Visits:   Reason for Discharge:  [name] told us he was back to work. He had been attending, arriving late.  Has not attended in over 4 weeks. Smoking History:  History  Smoking Status  . Former Smoker  . Packs/day: 1.00  . Years: 30.00  . Types: Cigarettes  . Quit date: 04/01/2016  Smokeless Tobacco  . Never Used    Diagnosis:  NSTEMI (non-ST elevated myocardial infarction) (HCC)  S/P PTCA (percutaneous transluminal coronary angioplasty)  ADL UCSD:   Initial Exercise Prescription:     Initial Exercise Prescription - 06/11/16 1200      Date of Initial Exercise RX and Referring Provider   Date 06/11/16   Referring Provider Lorine BearsArida, Muhammad MD     Treadmill   MPH 2.2   Grade 1   Minutes 15   METs 2.99     NuStep   Level 3   Watts 30   Minutes 15   METs 2     Recumbant Elliptical   Level 3   RPM 50   Watts 30   Minutes 15   METs 2     REL-XR   Level 2   Watts 30   Minutes 15   METs 2     T5 Nustep   Level 2   Watts 30   Minutes 15   METs 2     Biostep-RELP   Level 3   Watts 30   Minutes 15   METs 2     Prescription Details   Frequency (times per week) 3   Duration Progress to 30 minutes of continuous aerobic without signs/symptoms of physical distress     Intensity   THRR 40-80% of Max Heartrate 93-138   Ratings of Perceived Exertion 11-13   Perceived Dyspnea 0-4     Progression   Progression Continue to progress workloads to maintain intensity without signs/symptoms of physical distress.     Resistance Training   Training Prescription Yes   Weight 2   Reps 10-12      Discharge Exercise Prescription (Final Exercise Prescription Changes):     Exercise Prescription Changes  - 08/23/16 1300      Exercise Review   Progression Yes     Response to Exercise   Blood Pressure (Admit) 132/60   Blood Pressure (Exercise) 140/76   Blood Pressure (Exit) 128/74   Heart Rate (Admit) 80 bpm   Heart Rate (Exercise) 108 bpm   Heart Rate (Exit) 89 bpm   Rating of Perceived Exertion (Exercise) 13   Duration Progress to 45 minutes of aerobic exercise without signs/symptoms of physical distress   Intensity THRR unchanged     Progression   Average METs 3.3     Treadmill   MPH 3   Grade 0   Minutes 25   METs 3.3      Functional Capacity:     6 Minute Walk    Row Name 06/11/16 1249         6 Minute Walk   Phase Initial     Distance 1300 feet     Walk Time 6 minutes     # of Rest Breaks 0     MPH 2.46  METS 3.65     RPE 13     Perceived Dyspnea  2     VO2 Peak 12.77     Symptoms No     Resting HR 47 bpm     Resting BP 122/70     Max Ex. HR 113 bpm     Max Ex. BP 132/68        Psychological, QOL, Others - Outcomes: PHQ 2/9: Depression screen PHQ 2/9 06/11/2016  Decreased Interest 0  Down, Depressed, Hopeless 0  PHQ - 2 Score 0  Altered sleeping 0  Tired, decreased energy 1  Change in appetite 0  Feeling bad or failure about yourself  0  Trouble concentrating 0  Moving slowly or fidgety/restless 1  Suicidal thoughts 0  PHQ-9 Score 2  Difficult doing work/chores Somewhat difficult    Quality of Life:     Quality of Life - 06/11/16 1323      Quality of Life Scores   Health/Function Pre 21.2 %   Socioeconomic Pre 26.25 %   Psych/Spiritual Pre 30 %   Family Pre 27.6 %   GLOBAL Pre 25.03 %      Personal Goals: Goals established at orientation with interventions provided to work toward goal.     Personal Goals and Risk Factors at Admission - 06/11/16 1316      Core Components/Risk Factors/Patient Goals on Admission    Weight Management Yes;Weight Maintenance   Intervention Weight Management: Provide education and appropriate  resources to help participant work on and attain dietary goals.;Weight Management: Develop a combined nutrition and exercise program designed to reach desired caloric intake, while maintaining appropriate intake of nutrient and fiber, sodium and fats, and appropriate energy expenditure required for the weight goal.   Admit Weight 184 lb 8 oz (83.7 kg)   Goal Weight: Short Term 184 lb 8 oz (83.7 kg)   Goal Weight: Long Term 184 lb 8 oz (83.7 kg)   Expected Outcomes Weight Maintenance: Understanding of the daily nutrition guidelines, which includes 25-35% calories from fat, 7% or less cal from saturated fats, less than 200mg  cholesterol, less than 1.5gm of sodium, & 5 or more servings of fruits and vegetables daily   Sedentary Yes   Intervention Provide advice, education, support and counseling about physical activity/exercise needs.;Develop an individualized exercise prescription for aerobic and resistive training based on initial evaluation findings, risk stratification, comorbidities and participant's personal goals.   Expected Outcomes Achievement of increased cardiorespiratory fitness and enhanced flexibility, muscular endurance and strength shown through measurements of functional capacity and personal statement of participant.   Increase Strength and Stamina Yes   Intervention Provide advice, education, support and counseling about physical activity/exercise needs.;Develop an individualized exercise prescription for aerobic and resistive training based on initial evaluation findings, risk stratification, comorbidities and participant's personal goals.   Expected Outcomes Achievement of increased cardiorespiratory fitness and enhanced flexibility, muscular endurance and strength shown through measurements of functional capacity and personal statement of participant.   Hypertension Yes   Intervention Provide education on lifestyle modifcations including regular physical activity/exercise, weight  management, moderate sodium restriction and increased consumption of fresh fruit, vegetables, and low fat dairy, alcohol moderation, and smoking cessation.;Monitor prescription use compliance.   Expected Outcomes Short Term: Continued assessment and intervention until BP is < 140/6mm HG in hypertensive participants. < 130/70mm HG in hypertensive participants with diabetes, heart failure or chronic kidney disease.;Long Term: Maintenance of blood pressure at goal levels.   Lipids Yes  Intervention Provide education and support for participant on nutrition & aerobic/resistive exercise along with prescribed medications to achieve LDL 70mg , HDL >40mg .   Expected Outcomes Short Term: Participant states understanding of desired cholesterol values and is compliant with medications prescribed. Participant is following exercise prescription and nutrition guidelines.;Long Term: Cholesterol controlled with medications as prescribed, with individualized exercise RX and with personalized nutrition plan. Value goals: LDL < 70mg , HDL > 40 mg.       Personal Goals Discharge:     Goals and Risk Factor Review    Row Name 06/28/16 1333             Core Components/Risk Factors/Patient Goals Review   Personal Goals Review Increase Strength and Stamina       Review [name] stated he is feeling stronger since begnning Heart Track.       Expected Outcomes [name] will continue to be consistent with exercise and see improvement in overall function.          Nutrition & Weight - Outcomes:     Pre Biometrics - 06/11/16 1253      Pre Biometrics   Height 5' 8.5" (1.74 m)   Weight 184 lb 8 oz (83.7 kg)   Waist Circumference 40 inches   Hip Circumference 40 inches   Waist to Hip Ratio 1 %   BMI (Calculated) 27.7       Nutrition:     Nutrition Therapy & Goals - 06/29/16 1211      Nutrition Therapy   Diet Instructed on a heart healthy diet  based on 1900 calories and DASH diet principles.   Drug/Food  Interactions Statins/Certain Fruits   Protein (specify units) 8   Fiber 30 grams   Whole Grain Foods 3 servings   Saturated Fats 13 max. grams   Fruits and Vegetables 5 servings/day   Sodium 2000 grams  1500mg  ideal     Personal Nutrition Goals   Personal Goal #1 Read labels for saturated fat, trans fat and sodium.   Personal Goal #2 Include a vegetable or fruit or preferably both at meals.   Personal Goal #3 Include more whole grain servings. Refer to list.     Intervention Plan   Intervention Prescribe, educate and counsel regarding individualized specific dietary modifications aiming towards targeted core components such as weight, hypertension, lipid management, diabetes, heart failure and other comorbidities.;Nutrition handout(s) given to patient.   Expected Outcomes Short Term Goal: Understand basic principles of dietary content, such as calories, fat, sodium, cholesterol and nutrients.;Short Term Goal: A plan has been developed with personal nutrition goals set during dietitian appointment.;Long Term Goal: Adherence to prescribed nutrition plan.      Nutrition Discharge:     Nutrition Assessments - 06/29/16 1215      Rate Your Plate Scores   Pre Score 56   Pre Score % 62 %      Education Questionnaire Score:     Knowledge Questionnaire Score - 06/11/16 1313      Knowledge Questionnaire Score   Pre Score 21/28

## 2016-12-19 ENCOUNTER — Emergency Department: Payer: Managed Care, Other (non HMO)

## 2016-12-19 ENCOUNTER — Encounter: Payer: Self-pay | Admitting: Emergency Medicine

## 2016-12-19 ENCOUNTER — Inpatient Hospital Stay
Admission: EM | Admit: 2016-12-19 | Discharge: 2016-12-20 | DRG: 303 | Disposition: A | Discharge status: Home / Self Care | Payer: Managed Care, Other (non HMO) | Attending: Internal Medicine | Admitting: Internal Medicine

## 2016-12-19 DIAGNOSIS — E785 Hyperlipidemia, unspecified: Secondary | ICD-10-CM | POA: Diagnosis not present

## 2016-12-19 DIAGNOSIS — I25110 Atherosclerotic heart disease of native coronary artery with unstable angina pectoris: Principal | ICD-10-CM | POA: Diagnosis present

## 2016-12-19 DIAGNOSIS — I252 Old myocardial infarction: Secondary | ICD-10-CM | POA: Diagnosis not present

## 2016-12-19 DIAGNOSIS — K219 Gastro-esophageal reflux disease without esophagitis: Secondary | ICD-10-CM | POA: Diagnosis present

## 2016-12-19 DIAGNOSIS — Z7982 Long term (current) use of aspirin: Secondary | ICD-10-CM

## 2016-12-19 DIAGNOSIS — Z9049 Acquired absence of other specified parts of digestive tract: Secondary | ICD-10-CM | POA: Diagnosis not present

## 2016-12-19 DIAGNOSIS — I200 Unstable angina: Secondary | ICD-10-CM

## 2016-12-19 DIAGNOSIS — R0789 Other chest pain: Secondary | ICD-10-CM | POA: Diagnosis not present

## 2016-12-19 DIAGNOSIS — Z888 Allergy status to other drugs, medicaments and biological substances status: Secondary | ICD-10-CM | POA: Diagnosis not present

## 2016-12-19 DIAGNOSIS — Z8249 Family history of ischemic heart disease and other diseases of the circulatory system: Secondary | ICD-10-CM | POA: Diagnosis not present

## 2016-12-19 DIAGNOSIS — R079 Chest pain, unspecified: Secondary | ICD-10-CM | POA: Diagnosis not present

## 2016-12-19 DIAGNOSIS — Z87891 Personal history of nicotine dependence: Secondary | ICD-10-CM | POA: Diagnosis not present

## 2016-12-19 DIAGNOSIS — Z7902 Long term (current) use of antithrombotics/antiplatelets: Secondary | ICD-10-CM

## 2016-12-19 DIAGNOSIS — I10 Essential (primary) hypertension: Secondary | ICD-10-CM | POA: Diagnosis present

## 2016-12-19 DIAGNOSIS — Z79899 Other long term (current) drug therapy: Secondary | ICD-10-CM

## 2016-12-19 DIAGNOSIS — Z955 Presence of coronary angioplasty implant and graft: Secondary | ICD-10-CM | POA: Diagnosis not present

## 2016-12-19 DIAGNOSIS — Z7984 Long term (current) use of oral hypoglycemic drugs: Secondary | ICD-10-CM | POA: Diagnosis not present

## 2016-12-19 DIAGNOSIS — F419 Anxiety disorder, unspecified: Secondary | ICD-10-CM | POA: Diagnosis present

## 2016-12-19 DIAGNOSIS — E119 Type 2 diabetes mellitus without complications: Secondary | ICD-10-CM | POA: Diagnosis present

## 2016-12-19 HISTORY — DX: Personal history of other medical treatment: Z92.89

## 2016-12-19 LAB — CBC
HCT: 42.7 % (ref 40.0–52.0)
HEMOGLOBIN: 15.1 g/dL (ref 13.0–18.0)
MCH: 30.8 pg (ref 26.0–34.0)
MCHC: 35.4 g/dL (ref 32.0–36.0)
MCV: 87.1 fL (ref 80.0–100.0)
Platelets: 245 10*3/uL (ref 150–440)
RBC: 4.9 MIL/uL (ref 4.40–5.90)
RDW: 14.1 % (ref 11.5–14.5)
WBC: 7.4 10*3/uL (ref 3.8–10.6)

## 2016-12-19 LAB — GLUCOSE, CAPILLARY
Glucose-Capillary: 98 mg/dL (ref 65–99)
Glucose-Capillary: 98 mg/dL (ref 65–99)

## 2016-12-19 LAB — TROPONIN I

## 2016-12-19 LAB — BASIC METABOLIC PANEL
ANION GAP: 9 (ref 5–15)
BUN: 14 mg/dL (ref 6–20)
CALCIUM: 9.6 mg/dL (ref 8.9–10.3)
CO2: 23 mmol/L (ref 22–32)
Chloride: 108 mmol/L (ref 101–111)
Creatinine, Ser: 0.95 mg/dL (ref 0.61–1.24)
GFR calc Af Amer: >60 mL/min (ref >60)
GLUCOSE: 99 mg/dL (ref 65–99)
POTASSIUM: 4.5 mmol/L (ref 3.5–5.1)
SODIUM: 140 mmol/L (ref 135–145)

## 2016-12-19 LAB — LIPID PANEL
CHOLESTEROL: 120 mg/dL (ref 0–200)
HDL: 31 mg/dL — ABNORMAL LOW (ref >40)
LDL CALC: 64 mg/dL (ref 0–99)
TRIGLYCERIDES: 124 mg/dL (ref <150)
Total CHOL/HDL Ratio: 3.9 RATIO
VLDL: 25 mg/dL (ref 0–40)

## 2016-12-19 MED ORDER — ASPIRIN 81 MG PO CHEW
CHEWABLE_TABLET | ORAL | Status: AC
  Administered 2016-12-19: 324 mg via ORAL
  Filled 2016-12-19: qty 4

## 2016-12-19 MED ORDER — PANTOPRAZOLE SODIUM 40 MG PO TBEC
40.0000 mg | DELAYED_RELEASE_TABLET | Freq: Every day | ORAL | Status: DC
  Administered 2016-12-20: 40 mg via ORAL
  Filled 2016-12-19: qty 1

## 2016-12-19 MED ORDER — TICAGRELOR 90 MG PO TABS
90.0000 mg | ORAL_TABLET | Freq: Two times a day (BID) | ORAL | Status: DC
  Administered 2016-12-19 – 2016-12-20 (×2): 90 mg via ORAL
  Filled 2016-12-19 (×2): qty 1

## 2016-12-19 MED ORDER — ONDANSETRON HCL 4 MG PO TABS: 4.0000 mg | ORAL_TABLET | Freq: Four times a day (QID) | ORAL | Status: DC | PRN

## 2016-12-19 MED ORDER — METOPROLOL TARTRATE 25 MG PO TABS
25.0000 mg | ORAL_TABLET | Freq: Two times a day (BID) | ORAL | Status: DC
  Administered 2016-12-19 – 2016-12-20 (×2): 25 mg via ORAL
  Filled 2016-12-19 (×2): qty 1

## 2016-12-19 MED ORDER — SODIUM CHLORIDE 0.9 % IV SOLN
INTRAVENOUS | Status: DC
  Administered 2016-12-19 – 2016-12-20 (×2): via INTRAVENOUS

## 2016-12-19 MED ORDER — INSULIN ASPART 100 UNIT/ML ~~LOC~~ SOLN: 0.0000 [IU] | Freq: Every day | SUBCUTANEOUS | Status: DC

## 2016-12-19 MED ORDER — SODIUM CHLORIDE 0.9% FLUSH
3.0000 mL | Freq: Two times a day (BID) | INTRAVENOUS | Status: DC
  Administered 2016-12-19: 3 mL via INTRAVENOUS

## 2016-12-19 MED ORDER — ASPIRIN 81 MG PO CHEW
324.0000 mg | CHEWABLE_TABLET | Freq: Once | ORAL | Status: AC
  Administered 2016-12-19 (×2): 324 mg via ORAL

## 2016-12-19 MED ORDER — CYCLOBENZAPRINE HCL 10 MG PO TABS
10.0000 mg | ORAL_TABLET | Freq: Every day | ORAL | Status: DC
Start: 2016-12-19 — End: 2016-12-20
  Administered 2016-12-19: 10 mg via ORAL
  Filled 2016-12-19: qty 1

## 2016-12-19 MED ORDER — MORPHINE SULFATE (PF) 4 MG/ML IV SOLN: 1.0000 mg | INTRAVENOUS | Status: DC | PRN

## 2016-12-19 MED ORDER — SENNOSIDES-DOCUSATE SODIUM 8.6-50 MG PO TABS: 1.0000 | ORAL_TABLET | Freq: Every evening | ORAL | Status: DC | PRN

## 2016-12-19 MED ORDER — ONDANSETRON HCL 4 MG/2ML IJ SOLN: 4.0000 mg | Freq: Four times a day (QID) | INTRAMUSCULAR | Status: DC | PRN

## 2016-12-19 MED ORDER — HYDROCODONE-ACETAMINOPHEN 5-325 MG PO TABS
1.0000 | ORAL_TABLET | ORAL | Status: DC | PRN
  Administered 2016-12-19: 1 via ORAL
  Filled 2016-12-19: qty 1

## 2016-12-19 MED ORDER — NITROGLYCERIN 2 % TD OINT
TOPICAL_OINTMENT | TRANSDERMAL | Status: AC
  Administered 2016-12-19: 1 [in_us] via TOPICAL
  Filled 2016-12-19: qty 1

## 2016-12-19 MED ORDER — ATORVASTATIN CALCIUM 20 MG PO TABS
40.0000 mg | ORAL_TABLET | Freq: Every day | ORAL | Status: DC
  Administered 2016-12-19: 40 mg via ORAL
  Filled 2016-12-19: qty 2

## 2016-12-19 MED ORDER — ASPIRIN 81 MG PO CHEW
81.0000 mg | CHEWABLE_TABLET | Freq: Every day | ORAL | Status: DC
  Administered 2016-12-20: 81 mg via ORAL
  Filled 2016-12-19: qty 1

## 2016-12-19 MED ORDER — NITROGLYCERIN 0.4 MG SL SUBL: 0.4000 mg | SUBLINGUAL_TABLET | SUBLINGUAL | Status: DC | PRN

## 2016-12-19 MED ORDER — TRAMADOL HCL 50 MG PO TABS
50.0000 mg | ORAL_TABLET | Freq: Every day | ORAL | Status: DC
  Administered 2016-12-20: 50 mg via ORAL
  Filled 2016-12-19: qty 1

## 2016-12-19 MED ORDER — INSULIN ASPART 100 UNIT/ML ~~LOC~~ SOLN: 0.0000 [IU] | Freq: Three times a day (TID) | SUBCUTANEOUS | Status: DC

## 2016-12-19 MED ORDER — NITROGLYCERIN 2 % TD OINT
1.0000 [in_us] | TOPICAL_OINTMENT | Freq: Once | TRANSDERMAL | Status: AC
  Administered 2016-12-19 (×2): 1 [in_us] via TOPICAL

## 2016-12-19 MED ORDER — ACETAMINOPHEN 650 MG RE SUPP
650.0000 mg | Freq: Four times a day (QID) | RECTAL | Status: DC | PRN
Start: 2016-12-19 — End: 2016-12-20

## 2016-12-19 MED ORDER — ACETAMINOPHEN 325 MG PO TABS: 650.0000 mg | ORAL_TABLET | Freq: Four times a day (QID) | ORAL | Status: DC | PRN

## 2016-12-19 MED ORDER — GI COCKTAIL ~~LOC~~
30.0000 mL | Freq: Once | ORAL | Status: AC
  Administered 2016-12-19: 30 mL via ORAL
  Filled 2016-12-19: qty 30

## 2016-12-19 NOTE — Consult Note (Signed)
Cardiology Consult    Patient ID: Jesse Soto MRN: [id number], DOB/AGE: 1955-12-16   Admit date: 12/19/2016 Date of Consult: 12/19/2016  Primary Physician: [facility] Primary Cardiologist: M. Kirke Corin, MD  Requesting Provider: Camillia Herter, MD  Patient Profile    62 y/o ? with a h/o CAD s/p NSTEMI in 03/2016 w/ RCA DES and residual RCA and LAD dzs (nl FFR by cath in 04/2016), who presented to the [facility] ED on 12/20 with recurrent chest pain.  Past Medical History   Past Medical History:  Diagnosis Date  . Abdominal hernia   . Coronary artery disease    a. 03/2016 NSTEMI/PCI: 95% pRCA w/ large thrombus (thrombectomy + 4.0x23 Xience DES - 4.53mm); b. 04/2016 MV: intermediate w/ large ant defect but suboptimal 2/2 GI uptake; c. 04/2016 Cath: LM nl, LAD 60p (FFR 0.86), D1 40, D2 small, 100, LCX 100d, RCA patent prox/mid stent, 61m (FFR 0.88), RPDA min irregs.  . History of echocardiogram    a. 03/2016 Echo: EF 60-65% no rwma.  Marland Kitchen HLD (hyperlipidemia)   . Low back pain   . Psoriasis   . Tobacco abuse    a. quit 03/2016.    Past Surgical History:  Procedure Laterality Date  . CARDIAC CATHETERIZATION N/A 04/02/2016   Procedure: Left Heart Cath;  Surgeon: Iran Ouch, MD;  Location: [facility] INVASIVE CV LAB;  Service: Cardiovascular;  Laterality: N/A;  . CARDIAC CATHETERIZATION N/A 04/02/2016   Procedure: Coronary Stent Intervention;  Surgeon: Iran Ouch, MD;  Location: [facility] INVASIVE CV LAB;  Service: Cardiovascular;  Laterality: N/A;  . CARDIAC CATHETERIZATION Left 05/17/2016   Procedure: Left Heart Cath and Coronary Angiography;  Surgeon: Iran Ouch, MD;  Location: [facility] INVASIVE CV LAB;  Service: Cardiovascular;  Laterality: Left;  . CARDIAC CATHETERIZATION N/A 05/17/2016   Procedure: Intravascular Pressure Wire/FFR Study;  Surgeon: Iran Ouch, MD;  Location: [facility] INVASIVE CV LAB;  Service: Cardiovascular;  Laterality: N/A;  . CHOLECYSTECTOMY    . HERNIA REPAIR    .  KNEE SURGERY    . PERIPHERAL VASCULAR CATHETERIZATION  04/02/2016   Procedure: Coronary Angiogram ;  Surgeon: Iran Ouch, MD;  Location: [facility] INVASIVE CV LAB;  Service: Cardiovascular;;     Allergies  Allergies  Allergen Reactions  . Fluticasone Swelling  . Imdur [Isosorbide Dinitrate]     Headache    History of Present Illness    61 y/o ? with the above PMH including CAD s/p NSTEMI and PCI/DES to the RCA in 03/2016.  Case was complicated by distal embolization to the RPL1 and he required aggrastat infusion post-PCI.  Echo showed nl EF.  He had recurrent c/p in May and underwent stress testing, which showed significant GI uptake and anterior infarct.  Decision was made to repeat cath. This showed 60% RCA and LAD stenoses with patent RCA stent.  FFR was performed in both the RCA and LAD and was nl @ 0.88 and 0.86 respectively.  He had freq pvc's during cath and f/u holter showed only mild pvc burden.  He was placed on long acting nitrate but this was later d/c'd 2/2 headaches.  His statin dose also had to be reduced 2/2 myalgias.  He did well over the summer and throughout the fall.  He had been walking in the treadmill on a daily basis up until about three wks ago, when he couldn't get away from his work @ lunchtime to exercise due to a project that  he's been working on with a pending deadline.  This has created a lot of stress for him.    On Saturday evening (12/16), he was out to eat and after eating, developed left sided chest pain w/o associated Ss.  He took ntg and Ss resolved w/in about an hour.  He remained symptom free earlier this week but then today, he was standing @ work and suddenly felt the need to take a knee - he denies c/p, dyspnea, presyncope initially and can't really say what he felt that caused him to take a knee - but shortly thereafter he noted left sided chest pain and went to his desk and got a ntg w/ eventual relief.  Then he and a friend went to get lunch and after  ordering food, but before eating, he had recurrent left chest pain.  This time it was associated with mild dyspnea, nausea, and malaise.  His [facility] where c/p persisted for ~ 3 hrs prior to resolving after receiving asa and ntp.  He is currently pain free.  ECG is non-acute and trop is nl so far.  Inpatient Medications    . aspirin  81 mg Oral Daily  . atorvastatin  40 mg Oral q1800  . cyclobenzaprine  10 mg Oral QHS  . insulin aspart  0-5 Units Subcutaneous QHS  . insulin aspart  0-9 Units Subcutaneous TID WC  . metoprolol tartrate  25 mg Oral BID  . [START ON 12/20/2016] pantoprazole  40 mg Oral Daily  . sodium chloride flush  3 mL Intravenous Q12H  . ticagrelor  90 mg Oral BID  . [START ON 12/20/2016] traMADol  50 mg Oral Daily    Family History    Family History  Problem Relation Age of Onset  . Hypertension      Social History    Social History   Social History  . Marital status: Married    Spouse name: N/A  . Number of children: N/A  . Years of education: N/A   Occupational History  . Not on file.   Social History Main Topics  . Smoking status: Former Smoker    Packs/day: 1.00    Years: 30.00    Types: Cigarettes    Quit date: 04/01/2016  . Smokeless tobacco: Never Used  . Alcohol use Yes     Comment: occasional use  . Drug use: No  . Sexual activity: No   Other Topics Concern  . Not on file   Social History Narrative   [place].  Walks on treadmill daily.     Review of Systems    General:  No chills, fever, night sweats or weight changes.  Cardiovascular:  +++ chest pain assoc with n, dyspnea today. No recent dyspnea on exertion, edema, orthopnea, palpitations, paroxysmal nocturnal dyspnea. Dermatological: No rash, lesions/masses Respiratory: No cough, +++ dyspnea in setting of c/p today. Urologic: No hematuria, dysuria Abdominal:   No nausea, vomiting, diarrhea, bright red blood per rectum, melena, or  hematemesis Neurologic:  No visual changes, wkns, changes in mental status. All other systems reviewed and are otherwise negative except as noted above.  Physical Exam    Blood pressure 132/79, pulse 63, resp. rate 18, height 5\' 8"  (1.727 m), weight 180 lb (81.6 kg), SpO2 97 %.  General: Pleasant, NAD Psych: Normal affect. Neuro: Alert and oriented X 3. Moves all extremities spontaneously. HEENT: Normal  Neck: Supple without bruits or JVD. Lungs:  Resp regular  and unlabored, CTA. Heart: RRR no s3, s4, or murmurs. Abdomen: Soft, non-tender, non-distended, BS + x 4.  Extremities: No clubbing, cyanosis or edema. DP/PT/Radials 2+ and equal bilaterally.  Labs     Recent Labs  12/19/16 1325 12/19/16 1444  TROPONINI <0.03 <0.03   Lab Results  Component Value Date   WBC 7.4 12/19/2016   HGB 15.1 12/19/2016   HCT 42.7 12/19/2016   MCV 87.1 12/19/2016   PLT 245 12/19/2016    Recent Labs Lab 12/19/16 1325  NA 140  K 4.5  CL 108  CO2 23  BUN 14  CREATININE 0.95  CALCIUM 9.6  GLUCOSE 99   Lab Results  Component Value Date   CHOL 120 12/19/2016   HDL 31 (L) 12/19/2016   LDLCALC 64 12/19/2016   TRIG 124 12/19/2016    Radiology Studies    Dg Chest Portable 1 View  Result Date: 12/19/2016 CLINICAL DATA:  Left-sided chest pain radiating into the arm, initial encounter EXAM: PORTABLE CHEST 1 VIEW COMPARISON:  None. FINDINGS: The heart size and mediastinal contours are within normal limits. Both lungs are clear. The visualized skeletal structures are unremarkable. IMPRESSION: No active disease. Electronically Signed   By: Alcide CleverMark  Lukens M.D.   On: 12/19/2016 13:55   ECG & Cardiac Imaging    RSR, 81, non-specific st/t changes.  Assessment & Plan    1.  Unstable Angina/CAD:  Pt w/ a h/o CAD s/p NSTEMI and RCA DES in 03/2016 w/ patent RCA stent and residual mid RCA/LAD dzs by cath in May.  FFRs were nl @ that time.  He had been doing well but notes increased work stress.  He  presented to the ED today after having an episode of c/p on 12/16 and then two episodes of c/p today. Total duration of c/p today was ~ 3 hr.  Despite this, initial trop is nl.  He is currently c/p free.  Agree with admit/cycle CE.  Cont asa, brilinta,  blocker, and statin.  Will keep NPO after midnight.  As he has previously had an abnl stress test with stable dzs this spring and thus value of repeat stress testing is low - may ultimately need relook cath.  2.  HL:  LDL 64 today.  Cont lipitor 40.  Had myalgias @ higher dose.   3.  Tob Abuse:  He quit following MI.  Signed, Nicolasa Duckinghristopher Berge, NP 12/19/2016, 5:15 PM

## 2016-12-19 NOTE — Care Management Note (Signed)
Case Management Note  Patient Details  Name: Jesse Soto MRN: [id number] Date of Birth: Oct 20, 1955  Subjective/Objective:            Patient asked by md and he has verified he does have [facility] be[facility]ef[facility] but does not want to transfer. MD will have the pt. Sign the waiver .        Action/Plan:   Expected Discharge Date:                  Expected Discharge Plan:     In-House Referral:     Discharge planning Services     Post Acute Care Choice:    Choice offered to:     DME Arranged:    DME Agency:     HH Arranged:    HH Agency:     Status of Service:     If discussed at [employer] Length of Stay Meetings, dates discussed:    Additional Comments:  Jesse BueCheryl Scottie Stanish, RN 12/19/2016, 2:16 PM

## 2016-12-19 NOTE — H&P (Signed)
[employer] - Day at [facility]   PATIENT NAME: Jesse Soto Nilsson    MR#:  [id number]  DATE OF BIRTH:  09-22-1955  DATE OF ADMISSION:  12/19/2016  [facility]   REQUESTING/REFERRING PHYSICIAN:  Dr Darnelle Catalanmalinda  CHIEF COMPLAINT:   Chest pain HISTORY OF PRESENT ILLNESS:  Jesse Soto Tippets  is a 61 y.o. male with a known history of CAD status post drug-eluting stent to proximal RCA who presents with above complaint. Patient reports that he has been under a tremendous amount of stress at work and while he was at work today he had sudden onset of 10 out of 10 chest pain which brought him to the floor associated with shortness of breath and radiated to the jaw. He took 3 nitroglycerin with some relief and after morphine was given the emergency room the pain has subsided.  PAST MEDICAL HISTORY:   Past Medical History:  Diagnosis Date  . Abdominal hernia   . Coronary artery disease 03/2016    non-ST elevation myocardial infarction. Cardiac catheterization showed 95% proximal RCA stenosis with very large thrombus, moderate mid RCA stenosis, occluded distal left circumflex supplying OM 3 territory with collaterals and moderate mid LAD stenosis. EF was 45%. Successful aspiration thrombectomy and drug-eluting stent placement to the proximal right coronary artery   . HLD (hyperlipidemia)   . Low back pain   . Psoriasis   . Tobacco abuse     PAST SURGICAL HISTORY:   Past Surgical History:  Procedure Laterality Date  . CARDIAC CATHETERIZATION N/A 04/02/2016   Procedure: Left Heart Cath;  Surgeon: Iran OuchMuhammad A Arida, MD;  Location: [facility] INVASIVE CV LAB;  Service: Cardiovascular;  Laterality: N/A;  . CARDIAC CATHETERIZATION N/A 04/02/2016   Procedure: Coronary Stent Intervention;  Surgeon: Iran OuchMuhammad A Arida, MD;  Location: [facility] INVASIVE CV LAB;  Service: Cardiovascular;  Laterality: N/A;  . CARDIAC CATHETERIZATION Left 05/17/2016   Procedure: Left Heart  Cath and Coronary Angiography;  Surgeon: Iran OuchMuhammad A Arida, MD;  Location: [facility] INVASIVE CV LAB;  Service: Cardiovascular;  Laterality: Left;  . CARDIAC CATHETERIZATION N/A 05/17/2016   Procedure: Intravascular Pressure Wire/FFR Study;  Surgeon: Iran OuchMuhammad A Arida, MD;  Location: [facility] INVASIVE CV LAB;  Service: Cardiovascular;  Laterality: N/A;  . CHOLECYSTECTOMY    . HERNIA REPAIR    . KNEE SURGERY    . PERIPHERAL VASCULAR CATHETERIZATION  04/02/2016   Procedure: Coronary Angiogram ;  Surgeon: Iran OuchMuhammad A Arida, MD;  Location: [facility] INVASIVE CV LAB;  Service: Cardiovascular;;    SOCIAL HISTORY:   Social History  Substance Use Topics  . Smoking status: Former Smoker    Packs/day: 1.00    Years: 30.00    Types: Cigarettes    Quit date: 04/01/2016  . Smokeless tobacco: Never Used  . Alcohol use Yes     Comment: occasional use    FAMILY HISTORY:   Family History  Problem Relation Age of Onset  . Hypertension      DRUG ALLERGIES:   Allergies  Allergen Reactions  . Fluticasone Swelling  . Imdur [Isosorbide Dinitrate]     Headache    REVIEW OF SYSTEMS:   Review of Systems  Constitutional: Negative.  Negative for chills, fever and malaise/fatigue.  HENT: Negative.  Negative for ear discharge, ear pain, hearing loss, nosebleeds and sore throat.   Eyes: Negative.  Negative for blurred vision and pain.  Respiratory: Negative.  Negative for cough, hemoptysis, shortness of breath and wheezing.  Cardiovascular: Positive for chest pain. Negative for palpitations and leg swelling.  Gastrointestinal: Negative.  Negative for abdominal pain, blood in stool, diarrhea, nausea and vomiting.  Genitourinary: Negative.  Negative for dysuria.  Musculoskeletal: Negative.  Negative for back pain.  Skin: Negative.   Neurological: Negative for dizziness, tremors, speech change, focal weakness, seizures and headaches.  Endo/Heme/Allergies: Negative.  Does not bruise/bleed easily.   Psychiatric/Behavioral: Negative.  Negative for depression, hallucinations and suicidal ideas.    MEDICATIONS AT HOME:   Prior to Admission medications   Medication Sig Start Date End Date Taking? Authorizing Provider  aspirin 81 MG chewable tablet Chew 1 tablet (81 mg total) by mouth daily. 04/04/16  Yes Altamese Dilling, MD  atorvastatin (LIPITOR) 40 MG tablet Take 1 tablet (40 mg total) by mouth daily at 6 PM. 10/04/16  Yes Iran Ouch, MD  cyclobenzaprine (FLEXERIL) 10 MG tablet Take 10 mg by mouth at bedtime.  09/14/14  Yes Historical Provider, MD  metFORMIN (GLUCOPHAGE) 500 MG tablet Take 1 tablet (500 mg total) by mouth 2 (two) times daily with a meal. 10/30/16  Yes Marily Memos, MD  metoprolol tartrate (LOPRESSOR) 25 MG tablet Take 1 tablet (25 mg total) by mouth 2 (two) times daily. 10/04/16  Yes Iran Ouch, MD  pantoprazole (PROTONIX) 40 MG tablet Take 1 tablet (40 mg total) by mouth daily. 10/04/16  Yes Iran Ouch, MD  ticagrelor (BRILINTA) 90 MG TABS tablet Take 1 tablet (90 mg total) by mouth 2 (two) times daily. 10/04/16  Yes Iran Ouch, MD  traMADol (ULTRAM) 50 MG tablet Take 50 mg by mouth daily.    Yes Historical Provider, MD  nitroGLYCERIN (NITROSTAT) 0.4 MG SL tablet Place 1 tablet (0.4 mg total) under the tongue every 5 (five) minutes as needed for chest pain. 04/12/16   Iran Ouch, MD      VITAL SIGNS:  Blood pressure (!) 145/67, pulse 65, resp. rate 13, height 5\' 8"  (1.727 m), weight 81.6 kg (180 lb), SpO2 97 %.  PHYSICAL EXAMINATION:   Physical Exam  Constitutional: He is oriented to person, place, and time and well-developed, well-nourished, and in no distress. No distress.  HENT:  Head: Normocephalic.  Eyes: No scleral icterus.  Neck: Normal range of motion. Neck supple. No JVD present. No tracheal deviation present.  Cardiovascular: Normal rate, regular rhythm and normal heart sounds.  Exam reveals no gallop and no friction rub.    No murmur heard. Pulmonary/Chest: Effort normal and breath sounds normal. No respiratory distress. He has no wheezes. He has no rales. He exhibits no tenderness.  Abdominal: Soft. Bowel sounds are normal. He exhibits no distension and no mass. There is no tenderness. There is no rebound and no guarding.  Musculoskeletal: Normal range of motion. He exhibits no edema.  Neurological: He is alert and oriented to person, place, and time.  Skin: Skin is warm. No rash noted. No erythema.  Psychiatric: Affect and judgment normal.      LABORATORY PANEL:   CBC  Recent Labs Lab 12/19/16 1325  WBC 7.4  HGB 15.1  HCT 42.7  PLT 245   ------------------------------------------------------------------------------------------------------------------  Chemistries   Recent Labs Lab 12/19/16 1325  NA 140  K 4.5  CL 108  CO2 23  GLUCOSE 99  BUN 14  CREATININE 0.95  CALCIUM 9.6   ------------------------------------------------------------------------------------------------------------------  Cardiac Enzymes  Recent Labs Lab 12/19/16 1325  TROPONINI <0.03   ------------------------------------------------------------------------------------------------------------------  RADIOLOGY:  Dg Chest Portable 1 View  Result Date: 12/19/2016 CLINICAL DATA:  Left-sided chest pain radiating into the arm, initial encounter EXAM: PORTABLE CHEST 1 VIEW COMPARISON:  None. FINDINGS: The heart size and mediastinal contours are within normal limits. Both lungs are clear. The visualized skeletal structures are unremarkable. IMPRESSION: No active disease. Electronically Signed   By: Alcide CleverMark  Lukens M.D.   On: 12/19/2016 13:55    EKG:   Normal sinus rhythm no ST elevation  IMPRESSION AND PLAN:   61 year old male with CAD and recent stenting April to RCA who presents with unstable angina.  1. Unstable angina with chest pain similar to event in April: I have spoken with cardiology who will see  patient for consultation Continue monitoring on telemetry Continue following troponins. Continue atorvastatin, aspirin, metoprolol and Brilanta. Nitroglycerin when necessary and morphine when necessary  2. Diabetes: Start sliding scale insulin ADA diet  3. GERD on PPI  4. Essential hypertension: Continue metoprolol  5. Hyperlipidemia: Check lipid panel and continue statin All the records are reviewed and case discussed with ED provider. Management plans discussed with the patient and he is in agreement  CODE STATUS: full  TOTAL TIME TAKING CARE OF THIS PATIENT: 45 minutes.    Azula Zappia M.D on 12/19/2016 at 2:56 PM  Between 7am to 6pm - Pager - [phone]  After 6pm go to [email or web address] - Social research officer, governmentpassword [email or web address] [facility]  [facility] [facility]  Office  [phone]  CC: Primary care physician; [facility]

## 2016-12-19 NOTE — ED Provider Notes (Signed)
[facility] Emergency Department Provider Note   ____________________________________________   First MD Initiated Contact with Patient 12/19/16 1339     (approximate)  I have reviewed the triage vital signs and the nursing notes.   HISTORY  Chief Complaint Chest Pain   HPI Jesse Soto is a 61 y.o. male he reports he was at work and had sharp chest pain just like before he had his previous stent bring him to his knees he took 2 nitros a got better but then seemed to come back and his coworkers brought him to the emergency room. Patient complains of pain in the lower chest again just like before he had his stent with some sweating and nausea. Pain is severe at present does not radiate   Past Medical History:  Diagnosis Date  . Abdominal hernia   . Coronary artery disease 03/2016    non-ST elevation myocardial infarction. Cardiac catheterization showed 95% proximal RCA stenosis with very large thrombus, moderate mid RCA stenosis, occluded distal left circumflex supplying OM 3 territory with collaterals and moderate mid LAD stenosis. EF was 45%. Successful aspiration thrombectomy and drug-eluting stent placement to the proximal right coronary artery   . HLD (hyperlipidemia)   . Low back pain   . Psoriasis   . Tobacco abuse     Patient Active Problem List   Diagnosis Date Noted  . Coronary artery disease involving native coronary artery with angina pectoris (HCC) 06/08/2016  . Effort angina (HCC)   . Chest pain   . Acute MI, subendocardial, initial episode of care (HCC) 04/01/2016  . HTN (hypertension) 04/01/2016  . Subendocardial MI first episode care (HCC) 04/01/2016  . Coronary artery disease 03/31/2016    Past Surgical History:  Procedure Laterality Date  . CARDIAC CATHETERIZATION N/A 04/02/2016   Procedure: Left Heart Cath;  Surgeon: Iran OuchMuhammad A Arida, MD;  Location: [facility] INVASIVE CV LAB;  Service: Cardiovascular;  Laterality: N/A;  .  CARDIAC CATHETERIZATION N/A 04/02/2016   Procedure: Coronary Stent Intervention;  Surgeon: Iran OuchMuhammad A Arida, MD;  Location: [facility] INVASIVE CV LAB;  Service: Cardiovascular;  Laterality: N/A;  . CARDIAC CATHETERIZATION Left 05/17/2016   Procedure: Left Heart Cath and Coronary Angiography;  Surgeon: Iran OuchMuhammad A Arida, MD;  Location: [facility] INVASIVE CV LAB;  Service: Cardiovascular;  Laterality: Left;  . CARDIAC CATHETERIZATION N/A 05/17/2016   Procedure: Intravascular Pressure Wire/FFR Study;  Surgeon: Iran OuchMuhammad A Arida, MD;  Location: [facility] INVASIVE CV LAB;  Service: Cardiovascular;  Laterality: N/A;  . CHOLECYSTECTOMY    . HERNIA REPAIR    . KNEE SURGERY    . PERIPHERAL VASCULAR CATHETERIZATION  04/02/2016   Procedure: Coronary Angiogram ;  Surgeon: Iran OuchMuhammad A Arida, MD;  Location: [facility] INVASIVE CV LAB;  Service: Cardiovascular;;    Prior to Admission medications   Medication Sig Start Date End Date Taking? Authorizing Provider  aspirin 81 MG chewable tablet Chew 1 tablet (81 mg total) by mouth daily. 04/04/16  Yes Altamese DillingVaibhavkumar Vachhani, MD  atorvastatin (LIPITOR) 40 MG tablet Take 1 tablet (40 mg total) by mouth daily at 6 PM. 10/04/16  Yes Iran OuchMuhammad A Arida, MD  cyclobenzaprine (FLEXERIL) 10 MG tablet Take 10 mg by mouth at bedtime.  09/14/14  Yes Historical Provider, MD  metFORMIN (GLUCOPHAGE) 500 MG tablet Take 1 tablet (500 mg total) by mouth 2 (two) times daily with a meal. 10/30/16  Yes Marily MemosJason Mesner, MD  metoprolol tartrate (LOPRESSOR) 25 MG tablet Take 1 tablet (25 mg total) by mouth  2 (two) times daily. 10/04/16  Yes Iran Ouch, MD  pantoprazole (PROTONIX) 40 MG tablet Take 1 tablet (40 mg total) by mouth daily. 10/04/16  Yes Iran Ouch, MD  ticagrelor (BRILINTA) 90 MG TABS tablet Take 1 tablet (90 mg total) by mouth 2 (two) times daily. 10/04/16  Yes Iran Ouch, MD  traMADol (ULTRAM) 50 MG tablet Take 50 mg by mouth daily.    Yes Historical Provider, MD  nitroGLYCERIN (NITROSTAT) 0.4  MG SL tablet Place 1 tablet (0.4 mg total) under the tongue every 5 (five) minutes as needed for chest pain. 04/12/16   Iran Ouch, MD    Allergies Fluticasone and Imdur [isosorbide dinitrate]  Family History  Problem Relation Age of Onset  . Hypertension      Social History Social History  Substance Use Topics  . Smoking status: Former Smoker    Packs/day: 1.00    Years: 30.00    Types: Cigarettes    Quit date: 04/01/2016  . Smokeless tobacco: Never Used  . Alcohol use Yes     Comment: occasional use    Review of Systems Constitutional: No fever/chills Eyes: No visual changes. ENT: No sore throat. Cardiovascular: chest pain. Respiratory: Denies shortness of breath. Gastrointestinal: No abdominal pain.  No nausea, no vomiting.  No diarrhea.  No constipation. Genitourinary: Negative for dysuria. Musculoskeletal: Negative for back pain. Skin: Negative for rash. Neurological: Negative for headaches, focal weakness or numbness.  10-point ROS otherwise negative.  ____________________________________________   PHYSICAL EXAM:  VITAL SIGNS: ED Triage Vitals  Enc Vitals Group     BP 12/19/16 1330 137/64     Pulse Rate 12/19/16 1330 68     Resp 12/19/16 1330 16     Temp --      Temp src --      SpO2 12/19/16 1330 97 %     Weight 12/19/16 1315 180 lb (81.6 kg)     Height 12/19/16 1315 5\' 8"  (1.727 m)     Head Circumference --      Peak Flow --      Pain Score 12/19/16 1315 10     Pain Loc --      Pain Edu? --      Excl. in GC? --     Constitutional: Alert and oriented. In distress Eyes: Conjunctivae are normal. PERRL. EOMI. Head: Atraumatic. Nose: No congestion/rhinnorhea. Mouth/Throat: Mucous membranes are moist.  Oropharynx non-erythematous. Neck: No stridor.  Cardiovascular: Normal rate, regular rhythm. Grossly normal heart sounds.  Good peripheral circulation. Respiratory: Normal respiratory effort.  No retractions. Lungs CTAB. Gastrointestinal: Soft  and nontender except for small amount of epigastric tendern Musculoskeletal: No lower extremity tenderness nor edema.  No joint effusions. Neurologic:  Normal speech and language. No gross focal neurologic deficits are appreciated. No gait instability. Skin:  Skin is warm, dry and intact. No rash noted.   ____________________________________________   LABS (all labs ordered are listed, but only abnormal results are displayed)  Labs Reviewed  BASIC METABOLIC PANEL  CBC  TROPONIN I   ____________________________________________  EKG  EKG #1 read and interpreted by me shows normal sinus rhythm rate of 81 normal axis no acute ST-T wave changes KG #2 read and interpreted by me shows normal sinus rhythm a rate of 70 computer is reading pacer spikes which I do not see no significant change between EKG #1 an EKG #2 ____________________________________________  RADIOLOGY  Study Result   CLINICAL DATA:  Left-sided chest  pain radiating into the arm, initial encounter  EXAM: PORTABLE CHEST 1 VIEW  COMPARISON:  None.  FINDINGS: The heart size and mediastinal contours are within normal limits. Both lungs are clear. The visualized skeletal structures are unremarkable.  IMPRESSION: No active disease.   Electronically Signed   By: Alcide CleverMark  Lukens M.D.   On: 12/19/2016 13:55     ____________________________________________   PROCEDURES  Procedure(s) performed:   Procedures  Critical Care performed:   ____________________________________________   INITIAL IMPRESSION / ASSESSMENT AND PLAN / ED COURSE  Pertinent labs & imaging results that were available during my care of the patient were reviewed by me and considered in my medical decision making (see chart for details).    Clinical Course     Pain markedly improved with nitroglycerin      ____________________________________________   FINAL CLINICAL IMPRESSION(S) / ED DIAGNOSES  Final diagnoses:    Unstable angina (HCC)      NEW MEDICATIONS STARTED DURING THIS VISIT:  New Prescriptions   No medications on file     Note:  This document was prepared using Dragon voice recognition software and may include unintentional dictation errors.    Arnaldo NatalPaul F Nikoletta Varma, MD 12/19/16 2025

## 2016-12-19 NOTE — ED Triage Notes (Signed)
Pt comes into the ED via POV c/o left sided chest pain that is radiating into the left arm.  Patient presents with labored breathing and increased weakness.  Patient states he had a stent placed in April.

## 2016-12-19 NOTE — Progress Notes (Signed)
Patient admitted today from home with wife with chest pain. Has a history of stent placement in April of 2017. Treated with nitro paste, GI cocktail, and aspirin in ED and chest pain is currently gone. Patient oriented to unit and questions answered. Will be NPO after midnight until Dr. Kirke CorinArida sees patient in the morning for possible catheterization.

## 2016-12-20 ENCOUNTER — Inpatient Hospital Stay (HOSPITAL_BASED_OUTPATIENT_CLINIC_OR_DEPARTMENT_OTHER): Payer: Managed Care, Other (non HMO)

## 2016-12-20 ENCOUNTER — Telehealth: Payer: Self-pay | Admitting: Cardiovascular Disease

## 2016-12-20 DIAGNOSIS — R079 Chest pain, unspecified: Secondary | ICD-10-CM

## 2016-12-20 DIAGNOSIS — R0789 Other chest pain: Secondary | ICD-10-CM | POA: Diagnosis not present

## 2016-12-20 DIAGNOSIS — F419 Anxiety disorder, unspecified: Secondary | ICD-10-CM

## 2016-12-20 LAB — BASIC METABOLIC PANEL
ANION GAP: 5 (ref 5–15)
BUN: 17 mg/dL (ref 6–20)
CHLORIDE: 113 mmol/L — AB (ref 101–111)
CO2: 22 mmol/L (ref 22–32)
Calcium: 8.8 mg/dL — ABNORMAL LOW (ref 8.9–10.3)
Creatinine, Ser: 0.9 mg/dL (ref 0.61–1.24)
GFR calc non Af Amer: >60 mL/min (ref >60)
Glucose, Bld: 96 mg/dL (ref 65–99)
POTASSIUM: 3.6 mmol/L (ref 3.5–5.1)
Sodium: 140 mmol/L (ref 135–145)

## 2016-12-20 LAB — NM MYOCAR MULTI W/SPECT W/WALL MOTION / EF
CHL CUP MPHR: 160 {beats}/min
CHL CUP NUCLEAR SRS: 11
CSEPHR: 69 %
CSEPPHR: 111 {beats}/min
Estimated workload: 1 METS
Exercise duration (min): 0 min
Exercise duration (sec): 0 s
LVDIAVOL: 72 mL (ref 62–150)
LVSYSVOL: 31 mL
NUC STRESS TID: 0.98
Rest HR: 71 {beats}/min
SDS: 2
SSS: 6

## 2016-12-20 LAB — GLUCOSE, CAPILLARY
GLUCOSE-CAPILLARY: 108 mg/dL — AB (ref 65–99)
GLUCOSE-CAPILLARY: 110 mg/dL — AB (ref 65–99)

## 2016-12-20 LAB — CBC
HEMATOCRIT: 38.2 % — AB (ref 40.0–52.0)
HEMOGLOBIN: 13.4 g/dL (ref 13.0–18.0)
MCH: 30.5 pg (ref 26.0–34.0)
MCHC: 35.1 g/dL (ref 32.0–36.0)
MCV: 86.8 fL (ref 80.0–100.0)
Platelets: 184 10*3/uL (ref 150–440)
RBC: 4.4 MIL/uL (ref 4.40–5.90)
RDW: 13.8 % (ref 11.5–14.5)
WBC: 6.1 10*3/uL (ref 3.8–10.6)

## 2016-12-20 LAB — TROPONIN I: Troponin I: <0.03 ng/mL (ref <0.03)

## 2016-12-20 MED ORDER — TECHNETIUM TC 99M TETROFOSMIN IV KIT
28.4500 | PACK | Freq: Once | INTRAVENOUS | Status: AC | PRN
  Administered 2016-12-20: 28.45 via INTRAVENOUS

## 2016-12-20 MED ORDER — ENOXAPARIN SODIUM 40 MG/0.4ML ~~LOC~~ SOLN: 40.0000 mg | SUBCUTANEOUS | Status: DC

## 2016-12-20 MED ORDER — TECHNETIUM TC 99M TETROFOSMIN IV KIT
12.4800 | PACK | Freq: Once | INTRAVENOUS | Status: AC | PRN
  Administered 2016-12-20: 12.48 via INTRAVENOUS

## 2016-12-20 MED ORDER — HYDROCODONE-ACETAMINOPHEN 5-325 MG PO TABS: 1.0000 | ORAL_TABLET | ORAL | 0 refills | Status: AC | PRN

## 2016-12-20 MED ORDER — REGADENOSON 0.4 MG/5ML IV SOLN
0.4000 mg | Freq: Once | INTRAVENOUS | Status: AC
  Administered 2016-12-20: 0.4 mg via INTRAVENOUS
  Filled 2016-12-20: qty 5

## 2016-12-20 NOTE — Progress Notes (Signed)
   [facility] [address] [place], [place] [address]  December 20, 2016  Patient:  Jesse PollRicky Soto Date of Birth: 1955-07-17 Date of Visit:  12/19/2016  To Whom it May Concern:  Please excuse Jesse AreolaRicky D Soto from work from 12/19/2016 until 12/20/16 as he was [facility] for [facility] and has been receiving appropriate care. He may return to work on 12/26/16 per cardiology - Dr Kirke CorinArida, sooner if he feels he is able to return sooner than this date.      Please don't hesitate to contact me with questions or concerns by calling  [phone] and asking them to page me directly.   Marge Duncansave Hower, MD

## 2016-12-20 NOTE — Discharge Summary (Signed)
[employer] [facility] [place] at [facility]   PATIENT NAME: Jesse Soto    MR#:  [id number]  DATE OF BIRTH:  [birth year]  DATE OF ADMISSION:  12/19/2016 ADMITTING PHYSICIAN: Adrian Saran, MD  DATE OF DISCHARGE: 12/20/16  PRIMARY CARE PHYSICIAN: [facility]    ADMISSION DIAGNOSIS:  Unstable angina (HCC) [I20.0]  DISCHARGE DIAGNOSIS:  Chest pain CAD Active Problems:   Hyperlipidemia   Anxiety   SECONDARY DIAGNOSIS:   Past Medical History:  Diagnosis Date  . Abdominal hernia   . Coronary artery disease    a. 03/2016 NSTEMI/PCI: 95% pRCA w/ large thrombus (thrombectomy + 4.0x23 Xience DES - 4.68mm); b. 04/2016 MV: intermediate w/ large ant defect but suboptimal 2/2 GI uptake; c. 04/2016 Cath: LM nl, LAD 60p (FFR 0.86), D1 40, D2 small, 100, LCX 100d, RCA patent prox/mid stent, 63m (FFR 0.88), RPDA min irregs.  . History of echocardiogram    a. 03/2016 Echo: EF 60-65% no rwma.  Marland Kitchen HLD (hyperlipidemia)   . Low back pain   . Psoriasis   . Tobacco abuse    a. quit 03/2016.    HOSPITAL COURSE:  Jesse Soto  is a 61 y.o. male admitted 12/19/2016 with chief complaint Chest Pain . Please see H&P performed by Adrian Saran, MD for further information. Patient presented with the above symptoms. Given history evaluated by Cardiology underwent stress testing   There was no ST segment deviation noted during stress.  No T wave inversion was noted during stress.  Defect 1: There is a small defect of moderate severity present in the mid inferolateral location.  Findings consistent with prior myocardial infarction.  This is a low risk study.  The left ventricular ejection fraction is normal (55-65%).  This study is consistent with the patient's known coronary anatomy of chronically occluded distal left circumflex with collaterals.  DISCHARGE CONDITIONS:   stable  CONSULTS OBTAINED:  Treatment Team:  Yvonne Kendall, MD  DRUG ALLERGIES:   Allergies    Allergen Reactions  . Fluticasone Swelling  . Imdur [Isosorbide Dinitrate]     Headache    DISCHARGE MEDICATIONS:   Current Discharge Medication List    START taking these medications   Details  HYDROcodone-acetaminophen (NORCO/VICODIN) 5-325 MG tablet Take 1-2 tablets by mouth every 4 (four) hours as needed for moderate pain. Qty: 30 tablet, Refills: 0      CONTINUE these medications which have NOT CHANGED   Details  aspirin 81 MG chewable tablet Chew 1 tablet (81 mg total) by mouth daily. Qty: 30 tablet, Refills: 0    atorvastatin (LIPITOR) 40 MG tablet Take 1 tablet (40 mg total) by mouth daily at 6 PM. Qty: 90 tablet, Refills: 3    cyclobenzaprine (FLEXERIL) 10 MG tablet Take 10 mg by mouth at bedtime.     metFORMIN (GLUCOPHAGE) 500 MG tablet Take 1 tablet (500 mg total) by mouth 2 (two) times daily with a meal. Qty: 60 tablet, Refills: 0    metoprolol tartrate (LOPRESSOR) 25 MG tablet Take 1 tablet (25 mg total) by mouth 2 (two) times daily. Qty: 180 tablet, Refills: 3    pantoprazole (PROTONIX) 40 MG tablet Take 1 tablet (40 mg total) by mouth daily. Qty: 90 tablet, Refills: 3    ticagrelor (BRILINTA) 90 MG TABS tablet Take 1 tablet (90 mg total) by mouth 2 (two) times daily. Qty: 180 tablet, Refills: 3    traMADol (ULTRAM) 50 MG tablet Take 50 mg by mouth daily.  nitroGLYCERIN (NITROSTAT) 0.4 MG SL tablet Place 1 tablet (0.4 mg total) under the tongue every 5 (five) minutes as needed for chest pain. Qty: 25 tablet, Refills: 3         DISCHARGE INSTRUCTIONS:    DIET:  Cardiac diet  DISCHARGE CONDITION:  Stable  ACTIVITY:  Activity as tolerated  OXYGEN:  Home Oxygen: No.   Oxygen Delivery: room air  DISCHARGE LOCATION:  home   If you experience worsening of your admission symptoms, develop shortness of breath, life threatening emergency, suicidal or homicidal thoughts you must seek medical attention immediately by calling 911 or calling  your MD immediately  if symptoms less severe.  You Must read complete instructions/literature along with all the possible adverse reactions/side effects for all the Medicines you take and that have been prescribed to you. Take any new Medicines after you have completely understood and accpet all the possible adverse reactions/side effects.   Please note  You were cared for by a hospitalist during your hospital stay. If you have any questions about your discharge medications or the care you received while you were in the hospital after you are discharged, you can call the unit and asked to speak with the hospitalist on call if the hospitalist that took care of you is not available. Once you are discharged, your primary care physician will handle any further medical issues. Please note that NO REFILLS for any discharge medications will be authorized once you are discharged, as it is imperative that you return to your primary care physician (or establish a relationship with a primary care physician if you do not have one) for your aftercare needs so that they can reassess your need for medications and monitor your lab values.    On the day of Discharge:   VITAL SIGNS:  Blood pressure 140/79, pulse 66, temperature 97.5 F (36.4 C), temperature source Oral, resp. rate 16, height 5\' 8"  (1.727 m), weight 89.4 kg (197 lb), SpO2 97 %.  I/O:   Intake/Output Summary (Last 24 hours) at 12/20/16 1307 Last data filed at 12/20/16 0910  Gross per 24 hour  Intake                3 ml  Output             1275 ml  Net            -1272 ml    PHYSICAL EXAMINATION:  GENERAL:  61 y.o.-year-old patient lying in the bed with no acute distress.  EYES: Pupils equal, round, reactive to light and accommodation. No scleral icterus. Extraocular muscles intact.  HEENT: Head atraumatic, normocephalic. Oropharynx and nasopharynx clear.  NECK:  Supple, no jugular venous distention. No thyroid enlargement, no tenderness.   LUNGS: Normal breath sounds bilaterally, no wheezing, rales,rhonchi or crepitation. No use of accessory muscles of respiration.  CARDIOVASCULAR: S1, S2 normal. No murmurs, rubs, or gallops.  ABDOMEN: Soft, non-tender, non-distended. Bowel sounds present. No organomegaly or mass.  EXTREMITIES: No pedal edema, cyanosis, or clubbing.  NEUROLOGIC: Cranial nerves II through XII are intact. Muscle strength 5/5 in all extremities. Sensation intact. Gait not checked.  PSYCHIATRIC: The patient is alert and oriented x 3.  SKIN: No obvious rash, lesion, or ulcer.   DATA REVIEW:   CBC  Recent Labs Lab 12/20/16 0224  WBC 6.1  HGB 13.4  HCT 38.2*  PLT 184    Chemistries   Recent Labs Lab 12/20/16 0224  NA 140  K 3.6  CL 113*  CO2 22  GLUCOSE 96  BUN 17  CREATININE 0.90  CALCIUM 8.8*    Cardiac Enzymes  Recent Labs Lab 12/20/16 0224  TROPONINI <0.03    Microbiology Results  Results for orders placed or performed during the hospital encounter of 04/01/16  MRSA PCR Screening     Status: None   Collection Time: 04/02/16  6:21 PM  Result Value Ref Range Status   MRSA by PCR NEGATIVE NEGATIVE Final    Comment:        The GeneXpert MRSA Assay (FDA approved for NASAL specimens only), is one component of a comprehensive MRSA colonization surveillance program. It is not intended to diagnose MRSA infection nor to guide or monitor treatment for MRSA infections.     RADIOLOGY:  Nm Myocar Multi W/spect W/wall Motion / Ef  Result Date: 12/20/2016  There was no ST segment deviation noted during stress.  No T wave inversion was noted during stress.  Defect 1: There is a small defect of moderate severity present in the mid inferolateral location.  Findings consistent with prior myocardial infarction.  This is a low risk study.  The left ventricular ejection fraction is normal (55-65%).  This study is consistent with the patient's known coronary anatomy of chronically  occluded distal left circumflex with collaterals.    Dg Chest Portable 1 View  Result Date: 12/19/2016 CLINICAL DATA:  Left-sided chest pain radiating into the arm, initial encounter EXAM: PORTABLE CHEST 1 VIEW COMPARISON:  None. FINDINGS: The heart size and mediastinal contours are within normal limits. Both lungs are clear. The visualized skeletal structures are unremarkable. IMPRESSION: No active disease. Electronically Signed   By: Alcide CleverMark  Lukens M.D.   On: 12/19/2016 13:55     Management plans discussed with the patient, family and they are in agreement.  CODE STATUS:     Code Status Orders        Start     Ordered   12/19/16 1441  Full code  Continuous     12/19/16 1442    Code Status History    Date Active Date Inactive Code Status Order ID Comments User Context   04/02/2016  1:00 AM 04/04/2016  4:04 PM Full Code [id number]  Katharina Caperima Vaickute, MD Inpatient      TOTAL TIME TAKING CARE OF THIS PATIENT: 33 minutes.    Hower,  Mardi MainlandDavid K M.D on 12/20/2016 at 1:07 PM  Between 7am to 6pm - Pager - [phone]  After 6pm go to [email or web address] - Therapist, nutritionalpassword [email or web address]  [facility] [place] [facility]  Office  [phone]  CC: Primary care physician; [facility]

## 2016-12-20 NOTE — Progress Notes (Signed)
Patient Name: Edwinna AreolaRicky D Kimberlin Date of Encounter: 12/20/2016  Primary Cardiologist: Judie PetitM. Kirke CorinArida, MD   Hospital Problem List     Principal Problem:   Unstable angina [facility](HCC) Active Problems:   Anxiety   Hyperlipidemia     Subjective   No further c/p. Trop neg.  For MV this AM.  Inpatient Medications    . aspirin  81 mg Oral Daily  . atorvastatin  40 mg Oral q1800  . cyclobenzaprine  10 mg Oral QHS  . insulin aspart  0-5 Units Subcutaneous QHS  . insulin aspart  0-9 Units Subcutaneous TID WC  . metoprolol tartrate  25 mg Oral BID  . pantoprazole  40 mg Oral Daily  . sodium chloride flush  3 mL Intravenous Q12H  . ticagrelor  90 mg Oral BID  . traMADol  50 mg Oral Daily    Vital Signs    Vitals:   12/19/16 1640 12/19/16 1940 12/20/16 0357 12/20/16 0730  BP: 132/79 114/64 (!) 112/59 110/60  Pulse: 63 74 (!) 55 (!) 55  Resp: 18 18 18 16   Temp:  97.7 F (36.5 C) 98.5 F (36.9 C) 97.6 F (36.4 C)  TempSrc:  Oral Oral Oral  SpO2: 97% 97% 96% 94%  Weight: 197 lb (89.4 kg)     Height:        Intake/Output Summary (Last 24 hours) at 12/20/16 0833 Last data filed at 12/20/16 0756  Gross per 24 hour  Intake                3 ml  Output              725 ml  Net             -722 ml   Filed Weights   12/19/16 1315 12/19/16 1640  Weight: 180 lb (81.6 kg) 197 lb (89.4 kg)    Physical Exam   GEN: Well nourished, well developed, in no acute distress.  HEENT: Grossly normal.  Neck: Supple, no JVD, carotid bruits, or masses. Cardiac: RRR, no murmurs, rubs, or gallops. No clubbing, cyanosis, edema.  Radials/DP/PT 2+ and equal bilaterally.  Respiratory:  Respirations regular and unlabored, clear to auscultation bilaterally. GI: Soft, nontender, nondistended, BS + x 4. MS: no deformity or atrophy. Skin: warm and dry, no rash. Neuro:  Strength and sensation are intact. Psych: AAOx3.  Normal affect.  Labs    CBC  Recent Labs  12/19/16 1325 12/20/16 0224  WBC 7.4  6.1  HGB 15.1 13.4  HCT 42.7 38.2*  MCV 87.1 86.8  PLT 245 184   Basic Metabolic Panel  Recent Labs  12/19/16 1325 12/20/16 0224  NA 140 140  K 4.5 3.6  CL 108 113*  CO2 23 22  GLUCOSE 99 96  BUN 14 17  CREATININE 0.95 0.90  CALCIUM 9.6 8.8*   Cardiac Enzymes  Recent Labs  12/19/16 1444 12/19/16 2055 12/20/16 0224  TROPONINI <0.03 <0.03 <0.03   Fasting Lipid Panel  Recent Labs  12/19/16 1444  CHOL 120  HDL 31*  LDLCALC 64  TRIG [id number]  CHOLHDL 3.9   Telemetry    Sinus rhythm/sinus brady.  Radiology    Dg Chest Portable 1 View  Result Date: 12/19/2016 CLINICAL DATA:  Left-sided chest pain radiating into the arm, initial encounter EXAM: PORTABLE CHEST 1 VIEW COMPARISON:  None. FINDINGS: The heart size and mediastinal contours are within normal limits. Both lungs are clear. The visualized skeletal structures are  unremarkable. IMPRESSION: No active disease. Electronically Signed   By: Alcide CleverMark  Lukens M.D.   On: 12/19/2016 13:55    Patient Profile     61 y/o ? with a h/o CAD s/p NSTEMI in 03/2016 w/ RCA DES and residual RCA and LAD dzs (nl FFR by cath in 04/2016), who [facility] with recurrent chest pain.  Trop neg.  Assessment & Plan    1.  Unstable Angina/CAD:  Pt w/ a h/o CAD s/p NSTEMI and RCA DES in 03/2016 w/ patent RCA stent and residual mid RCA/LAD dzs by cath in May.  FFRs were nl @ that time.  He had been doing well but notes increased work stress.  He presented to the ED 12/20 after having an episode of c/p on 12/16 and then two episodes of c/p on day of admission.  Despite prolonged Ss on 12/20 (3 hrs), trops are nl x 3.  Discussed with Dr. Kirke CorinArida.  Will plan on repeat myoview.  If imaging similar to 04/2016 MV, would not pursue further ischemic eval.  Cont asa, statin,  blocker, brilinta.  2.  HL:  LDL 64 12/20.  Cont lipitor 40.  Had myalgias @ higher dose.   3.  Tob Abuse:  He quit following MI.  Signed, Nicolasa Duckinghristopher Rankin Coolman  NP 12/20/2016, 8:33 AM

## 2016-12-20 NOTE — Progress Notes (Signed)
Patient had an 8 beat run of V tach.  Resolved without intervention.  Otherwise has been Sinus Huston FoleyBrady.

## 2016-12-20 NOTE — Telephone Encounter (Signed)
[facility]cm ph [facility] for [place].  Needs 2 weeks fu with Arida   Scheduled 01/08/17 with Ward Givenshris Berge at 3

## 2016-12-20 NOTE — Telephone Encounter (Signed)
Attempted TCM call. Left message on cell VM for patient to contact the office.

## 2016-12-20 NOTE — Care Management (Signed)
Patient admitted with unstable angina.  [facility]  [facility] and fax[facility] H/P and cardiology notes.  Cardiology is consulting and plans for stress today

## 2016-12-21 NOTE — Telephone Encounter (Signed)
Left message on home VM for patient to contact the office.  Left message on cell VM for patient to contact the office.

## 2016-12-25 NOTE — Telephone Encounter (Signed)
Patient contacted regarding discharge from [facility] on 12/20/16.  Patient understands to follow up with provider Ward Givenshris Berge on 01/08/17 at 3pm at [facility]. Patient understands discharge instructions? yes Patient understands medications and regiment? yes Patient understands to bring all medications to this visit? yes

## 2017-01-08 ENCOUNTER — Ambulatory Visit (INDEPENDENT_AMBULATORY_CARE_PROVIDER_SITE_OTHER): Payer: Commercial Managed Care - PPO | Admitting: Nurse Practitioner

## 2017-01-08 ENCOUNTER — Encounter: Payer: Self-pay | Admitting: Nurse Practitioner

## 2017-01-08 VITALS — BP 140/80 | HR 74 | Ht 69.0 in | Wt 189.5 lb

## 2017-01-08 DIAGNOSIS — I25119 Atherosclerotic heart disease of native coronary artery with unspecified angina pectoris: Secondary | ICD-10-CM

## 2017-01-08 DIAGNOSIS — E782 Mixed hyperlipidemia: Secondary | ICD-10-CM

## 2017-01-08 DIAGNOSIS — R030 Elevated blood-pressure reading, without diagnosis of hypertension: Secondary | ICD-10-CM

## 2017-01-08 DIAGNOSIS — F419 Anxiety disorder, unspecified: Secondary | ICD-10-CM | POA: Diagnosis not present

## 2017-01-08 NOTE — Progress Notes (Signed)
Office Visit    Patient Name: Jesse Soto Date of Encounter: 01/08/2017  Primary Care Provider:  [facility] Primary Cardiologist:  M. Kirke Corin, MD   Chief Complaint    62 year old male with prior history of CAD status post non-STEMI and PCI of the RCA and 4 2017 with known chronic total occlusion of the left circumflex and moderate, nonobstructive LAD disease, hyperlipidemia, anxiety, and remote tobacco abuse, who presents for follow-up after recent hospital physician for chest pain.  Past Medical History    Past Medical History:  Diagnosis Date  . Abdominal hernia   . Coronary artery disease    a. 03/2016 NSTEMI/PCI: 95% pRCA w/ large thrombus (thrombectomy + 4.0x23 Xience DES - 4.60mm); b. 04/2016 MV: intermediate w/ large ant defect but suboptimal 2/2 GI uptake; c. 04/2016 Cath: LM nl, LAD 60p (FFR 0.86), D1 40, D2 small, 100, LCX 100d, RCA patent prox/mid stent, 72m (FFR 0.88), RPDA min irregs;  d. 11/2016 MV: small mid-inflat defect - infarct w/o ishcmia, EF 55-65%.  . History of echocardiogram    a. 03/2016 Echo: EF 60-65% no rwma.  Marland Kitchen HLD (hyperlipidemia)   . Low back pain   . Psoriasis   . Tobacco abuse    a. quit 03/2016.   Past Surgical History:  Procedure Laterality Date  . CARDIAC CATHETERIZATION N/A 04/02/2016   Procedure: Left Heart Cath;  Surgeon: Iran Ouch, MD;  Location: [facility] INVASIVE CV LAB;  Service: Cardiovascular;  Laterality: N/A;  . CARDIAC CATHETERIZATION N/A 04/02/2016   Procedure: Coronary Stent Intervention;  Surgeon: Iran Ouch, MD;  Location: [facility] INVASIVE CV LAB;  Service: Cardiovascular;  Laterality: N/A;  . CARDIAC CATHETERIZATION Left 05/17/2016   Procedure: Left Heart Cath and Coronary Angiography;  Surgeon: Iran Ouch, MD;  Location: [facility] INVASIVE CV LAB;  Service: Cardiovascular;  Laterality: Left;  . CARDIAC CATHETERIZATION N/A 05/17/2016   Procedure: Intravascular Pressure Wire/FFR Study;  Surgeon: Iran Ouch,  MD;  Location: [facility] INVASIVE CV LAB;  Service: Cardiovascular;  Laterality: N/A;  . CHOLECYSTECTOMY    . HERNIA REPAIR    . KNEE SURGERY    . PERIPHERAL VASCULAR CATHETERIZATION  04/02/2016   Procedure: Coronary Angiogram ;  Surgeon: Iran Ouch, MD;  Location: [facility] INVASIVE CV LAB;  Service: Cardiovascular;;    Allergies  Allergies  Allergen Reactions  . Fluticasone Swelling  . Imdur [Isosorbide Dinitrate]     Headache    History of Present Illness    61 year old male with above Past medical history including CAD status post non-STEMI and PCI/DES to the RCA and 40,017. Case complicated by distal embolization to the RPL 1 and he required Aggrastat infusion post PCI. Echo showed normal LV function. He underwent stress testing in May 2017 which showed anterior infarct with GI uptake. Decision was made to repeat catheterization which showed 60% RCA and LAD stenoses with patent RCA stent. Fractional flow reserve was performed within the LAD and RCA and was normal (0.86 and 0.88 respectively). He was hospitalized in late December with atypical chest pain and ruled out for MI. Stress testing was undertaken again and was no for a small moderate sized mid inferolateral defect consistent with prior infarct. No ischemia was noted. EF was normal. Medical therapy was recommended. Since discharge, he has continued to have work and life stress. He notes that he has a fairly short fuse and gets upset fairly easily. 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 He has had at least one or 2 episodes of very atypical, brief and fleeting chest pain which he says is associated with an extra heartbeat. He has not had any limitations in activity, dyspnea, PND, orthopnea, dizziness, syncope, edema, or early satiety.   Home Medications    Prior to Admission medications   Medication Sig Start Date End Date Taking? Authorizing Provider  aspirin 81 MG chewable  tablet Chew 1 tablet (81 mg total) by mouth daily. 04/04/16  Yes Altamese Dilling, MD  atorvastatin (LIPITOR) 40 MG tablet Take 1 tablet (40 mg total) by mouth daily at 6 PM. 10/04/16  Yes Iran Ouch, MD  cyclobenzaprine (FLEXERIL) 10 MG tablet Take 10 mg by mouth at bedtime.  09/14/14  Yes Historical Provider, MD  HYDROcodone-acetaminophen (NORCO/VICODIN) 5-325 MG tablet Take 1-2 tablets by mouth every 4 (four) hours as needed for moderate pain. 12/20/16  Yes Wyatt Haste, MD  metFORMIN (GLUCOPHAGE) 500 MG tablet Take 1 tablet (500 mg total) by mouth 2 (two) times daily with a meal. 10/30/16  Yes Marily Memos, MD  metoprolol tartrate (LOPRESSOR) 25 MG tablet Take 1 tablet (25 mg total) by mouth 2 (two) times daily. 10/04/16  Yes Iran Ouch, MD  nitroGLYCERIN (NITROSTAT) 0.4 MG SL tablet Place 1 tablet (0.4 mg total) under the tongue every 5 (five) minutes as needed for chest pain. 04/12/16  Yes Iran Ouch, MD  pantoprazole (PROTONIX) 40 MG tablet Take 1 tablet (40 mg total) by mouth daily. 10/04/16  Yes Iran Ouch, MD  ticagrelor (BRILINTA) 90 MG TABS tablet Take 1 tablet (90 mg total) by mouth 2 (two) times daily. 10/04/16  Yes Iran Ouch, MD  traMADol (ULTRAM) 50 MG tablet Take 50 mg by mouth daily.    Yes Historical Provider, MD    Review of Systems    Anxiety and short temper as noted above. Continues to have fleeting intermittent atypical chest pain. All other systems reviewed and are otherwise negative except as noted above.  Physical Exam    VS:  BP 140/80 (BP Location: Left Arm, Patient Position: Sitting, Cuff Size: Normal)   Pulse 74   Ht 5\' 9"  (1.753 m)   Wt 189 lb 8 oz (86 kg)   BMI 27.98 kg/m  , BMI Body mass index is 27.98 kg/m. GEN: Well nourished, well developed, in no acute distress.  HEENT: normal.  Neck: Supple, no JVD, carotid bruits, or masses. Cardiac: RRR, no murmurs, rubs, or gallops. No clubbing, cyanosis, edema.  Radials/DP/PT 2+ and  equal bilaterally.  Respiratory:  Respirations regular and unlabored, clear to auscultation bilaterally. GI: Soft, nontender, nondistended, BS + x 4. MS: no deformity or atrophy. Skin: warm and dry, no rash. Neuro:  Strength and sensation are intact. Psych: Normal affect.  Accessory Clinical Findings    ECG - Regular sinus rhythm, 74, no acute ST or T changes.  Assessment & Plan    1.  Coronary artery disease/atypical chest pain: Status post prior non-STEMI and DES to the RCA in April 2017. This was patent on catheterization May 2017. Fractional flow reserve in the mid RCA and LAD were normal at that time. He was recently admitted to [facility] with atypical chest pain, ruled out, and had a low risk Myoview without evidence of ischemia. EF was normal. He has not been having significant chest pain since discharge. He occasionally notes a fleeting left-sided chest discomfort. Continue medical therapy  with aspirin, Brilinta, beta blocker, and statin.    2. Hyperlipidemia: LDL 64. Continue Lipitor 40. He did have myalgias at a higher dose.  3. Elevated blood pressure: Blood pressure is 140/80 today. He is somewhat stressed and did just come from work. He does have a cuff at home. I have asked him to check his blood pressure once daily at home and call us if his trends are greater than 130. At that point, we could consider addition of ACE inhibitor therapy.  4. Tobacco abuse: He quit following his MI.  5. Anxiety: He is concerned about his working life stress. His wife pointed out to him that he is a very short temper and is concerned about this. He also has trouble sleeping. He has follow-up with primary care soon have recommended that he touch base with his primary care provider regarding his issues as he may benefit from anxiolytic therapy.  6. Disposition: Follow-up with Dr. Kirke CorinArida in 3-4 months or sooner if necessary. He will contact us if blood pressures at home are greater than 130  consistently.   Nicolasa Duckinghristopher Nicklous Aburto, NP 01/08/2017, 3:12 PM

## 2017-01-08 NOTE — Patient Instructions (Signed)
Medication Instructions:  Continue current medications.   Labwork: - None ordered.   Testing/Procedures: - None ordered.   Follow-Up: Your physician wants you to follow-up in: 3-4 MONTHS WITH DR Kirke CorinARIDA. You will receive a reminder letter in the mail two months in advance. If you don't receive a letter, please call our office to schedule the follow-up appointment.   If you need a refill on your cardiac medications before your next appointment, please call your pharmacy.

## 2017-01-23 ENCOUNTER — Telehealth: Payer: Self-pay | Admitting: Cardiovascular Disease

## 2017-01-23 NOTE — Telephone Encounter (Signed)
Received records request Irven CoeSedgwick, FMLA, [facility].

## 2017-07-02 ENCOUNTER — Ambulatory Visit: Payer: Commercial Managed Care - PPO | Admitting: Cardiovascular Disease

## 2017-07-19 ENCOUNTER — Telehealth: Payer: Self-pay | Admitting: Cardiovascular Disease

## 2017-07-19 NOTE — Telephone Encounter (Signed)
3 attempts to schedule fu appt from recall list.   Deleting recall.  3 month fu per ckout 01/08/17 Arida

## 2017-08-08 IMAGING — CT CT ABD-PELV W/ CM
2 of 5 series · 16 of 46 positions shown, 18 images · IV contrast (APPLIED)
Comparison: No comparison CT abdomen/pelvis.

CLINICAL DATA: 61-year-old male with lower abdominal pain since
this morning. Initial encounter.

EXAM:
CT ABDOMEN AND PELVIS WITH CONTRAST
TECHNIQUE: Multidetector CT imaging of the abdomen and pelvis was performed
using the standard protocol following bolus administration of
intravenous contrast.
CONTRAST:  1 [id number] IOPAMIDOL ([id number]) INJECTION 61%

[Series 2: abd/ pelvis 5.0 i30f 1 · axial · 0.83mm/px · z∈[+663,+1088]mm · 13 of 96 slices shown, 15 images]
[im 6/96  soft-tissue]
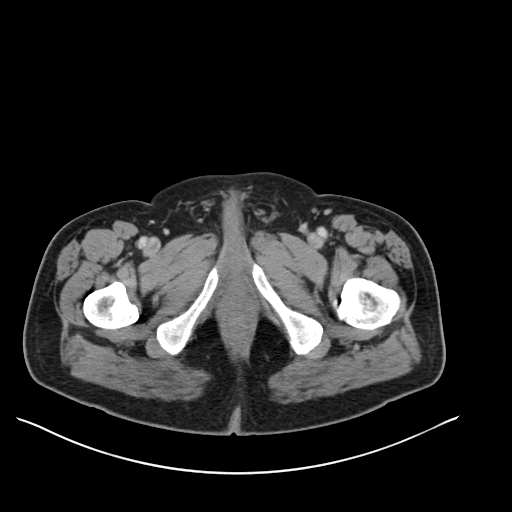
[im 6/96  bone]
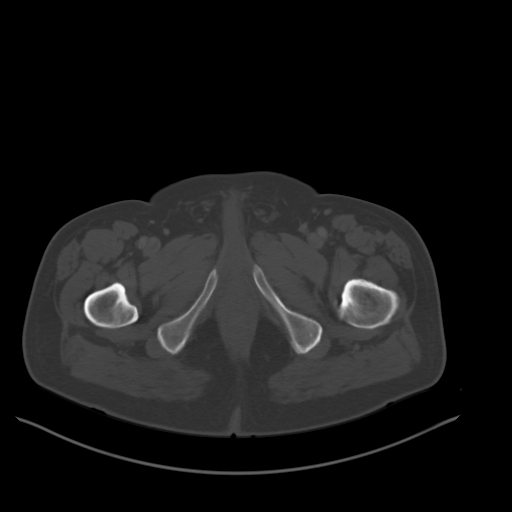
[im 16/96  soft-tissue]
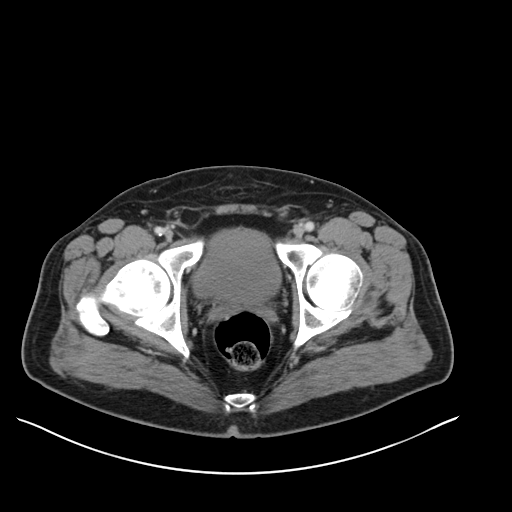
[im 21/96  soft-tissue]
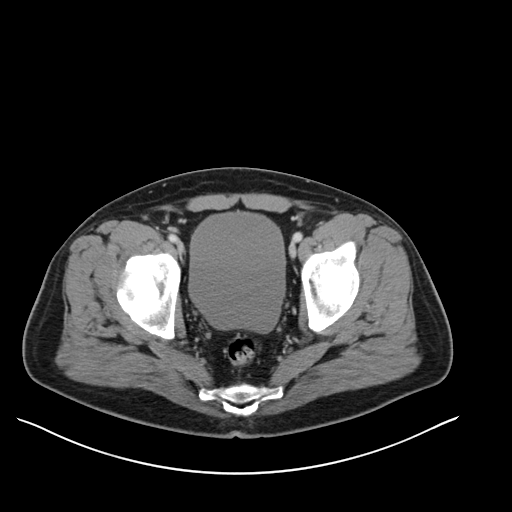
[im 26/96  soft-tissue]
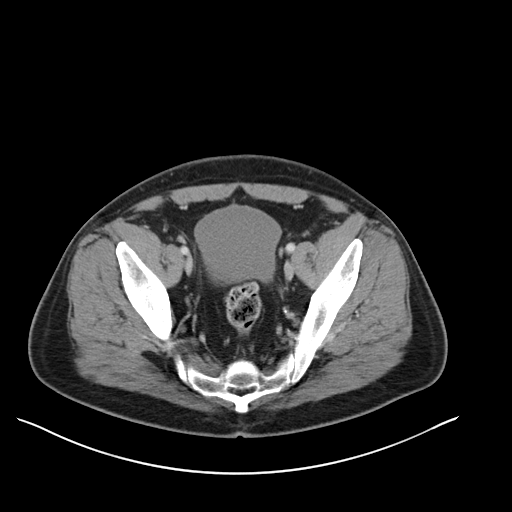
[im 36/96  soft-tissue]
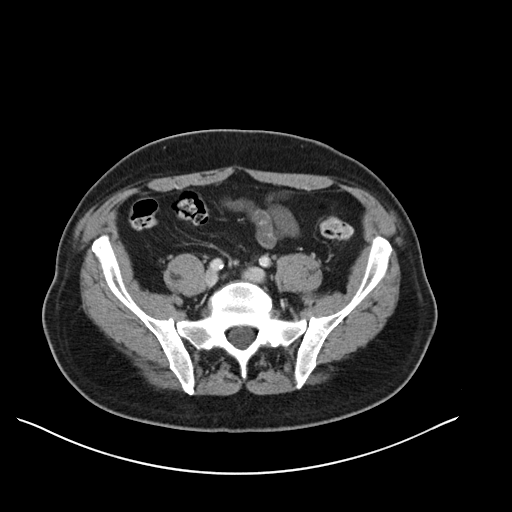
[im 41/96  soft-tissue]
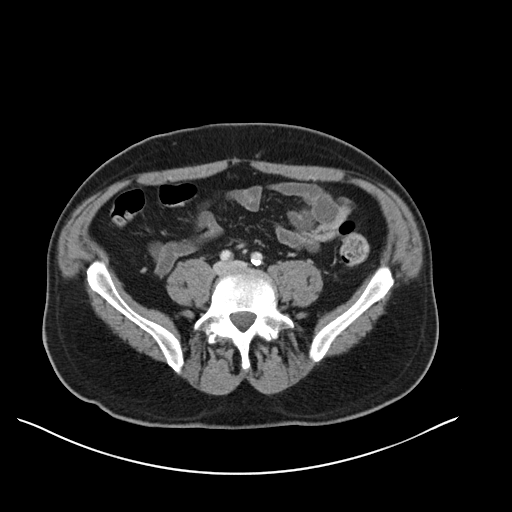
[im 51/96  soft-tissue]
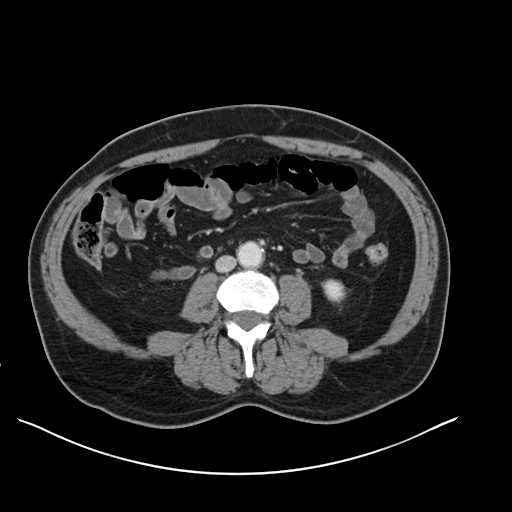
[im 56/96  soft-tissue]
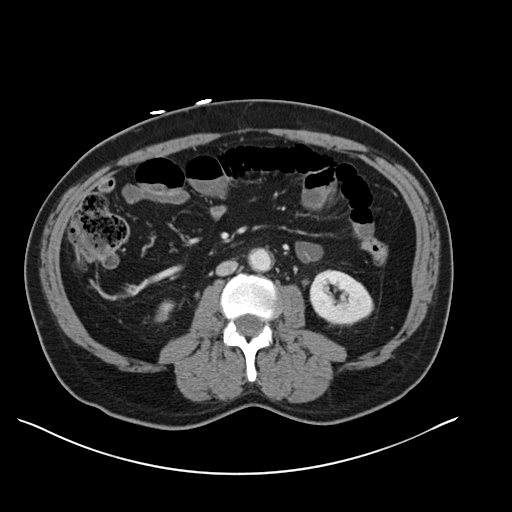
[im 61/96  soft-tissue]
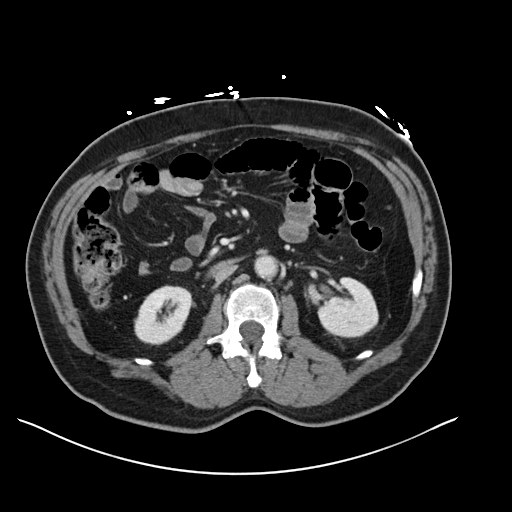
[im 61/96  bone]
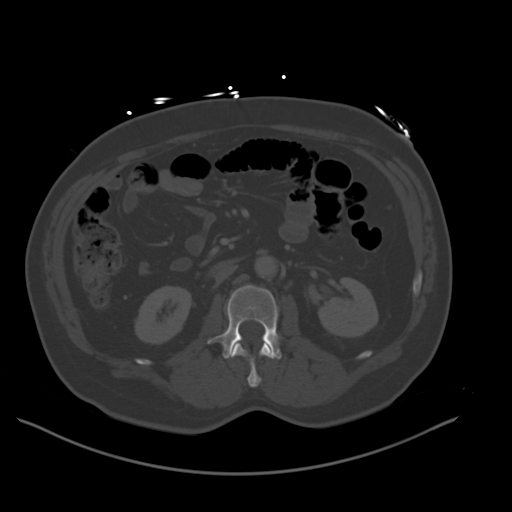
[im 71/96  soft-tissue]
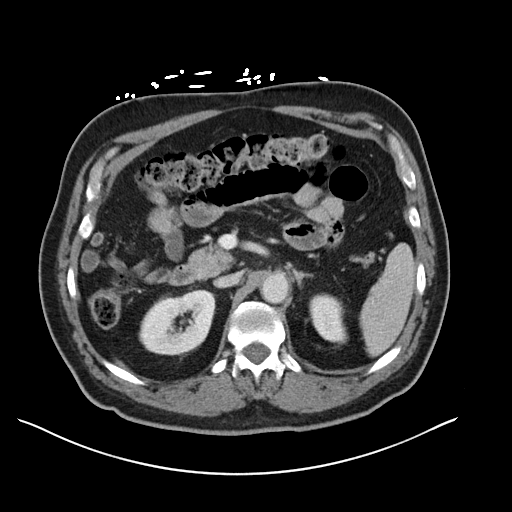
[im 76/96  soft-tissue]
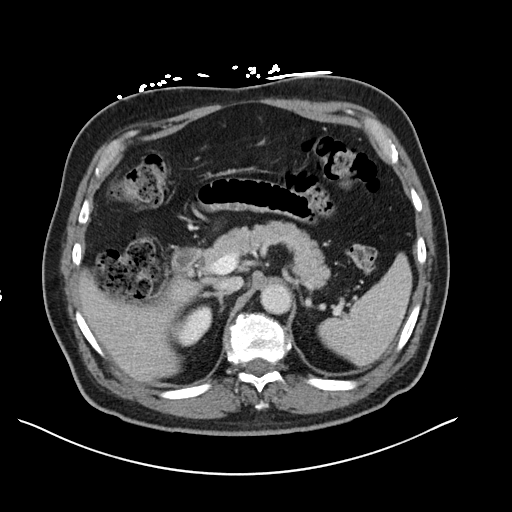
[im 81/96  soft-tissue]
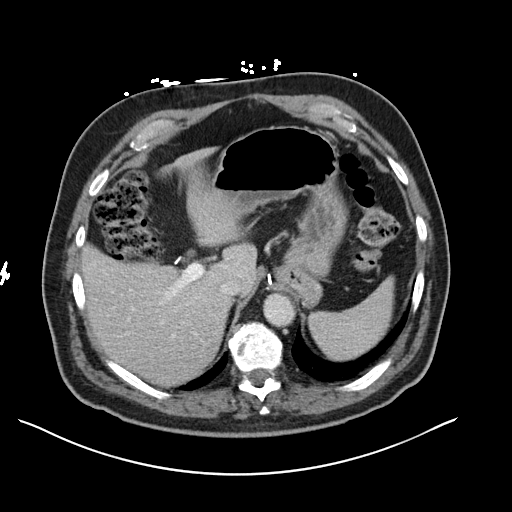
[im 91/96  soft-tissue]
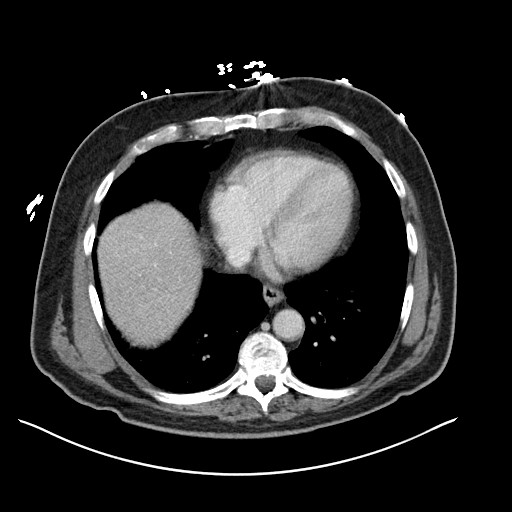

[Series 5: coronal soft tissue · coronal · 0.79mm/px · 3 of 101 slices shown]
[im 34/101  soft-tissue]
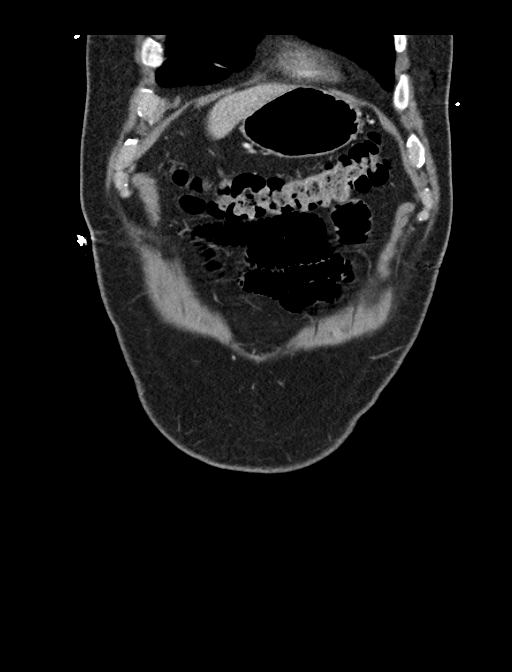
[im 45/101  soft-tissue]
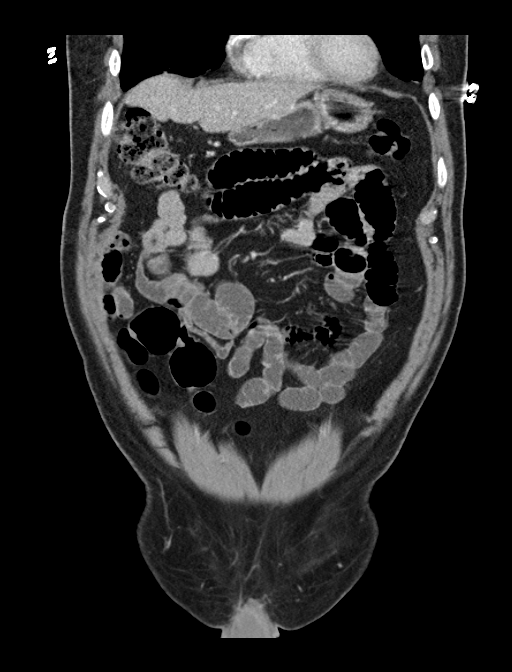
[im 56/101  soft-tissue]
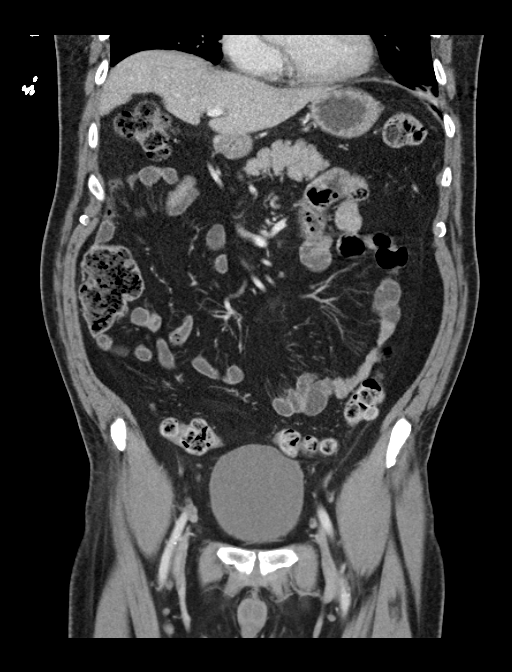

[16 of 46 positions shown; findings below may reference images not displayed]

FINDINGS: Lower chest: Lung bases clear. Coronary artery calcifications. Heart
size within normal limits.

Hepatobiliary: No focal hepatic lesion.  Post cholecystectomy.

Pancreas: No mass or inflammation.

Spleen: No mass or enlargement.

Adrenals/Urinary Tract: No worrisome renal or adrenal lesion. Tiny
renal cysts suspected. No renal or ureteral obstructing stone. No
hydronephrosis.

Stomach/Bowel: Postsurgical changes gastroesophageal junction which
may represent attempted reflux surgery.

Gas and partially fluid-filled slightly prominence size small bowel
loops without point of obstruction noted. Etiology indeterminate.
Question enteritis. No extra luminal bowel inflammatory process,
free fluid or free air. Specifically, no inflammation surrounds the
appendix.

Vascular/Lymphatic: Calcified plaque aorta with ectasia measuring up
to 2.5 cm. No large vessel occlusion. Plaque with mild narrowing
iliac arteries. No adenopathy.

Reproductive: Negative.

Other: Fat within the inguinal canal without evidence of bowel
containing hernia.

Musculoskeletal: Degenerative changes lower thoracic spine and
lumbar spine. No osseous destructive lesion.
IMPRESSION: Gas and fluid-filled proximal to mid small bowel loops may represent
changes of enteritis without point of obstruction noted. No extra
luminal bowel inflammatory process or free air. No inflammation
surrounds the appendix.

Post attempted gastroesophageal reflux surgery.

Atherosclerotic changes aorta, iliac arteries and coronary arteries.
Aorta is ectatic measuring up to 2.5 cm.
